# Patient Record
Sex: Female | Born: 1990 | Race: Black or African American | Hispanic: No | Marital: Single | Smoking: Light tobacco smoker
Health system: Southern US, Community
[De-identification: ages and names within clinical notes are randomized; demographics above are authoritative.]

## PROBLEM LIST (undated history)

## (undated) DIAGNOSIS — IMO0002 Reserved for concepts with insufficient information to code with codable children: Secondary | ICD-10-CM

## (undated) DIAGNOSIS — J45909 Unspecified asthma, uncomplicated: Secondary | ICD-10-CM

## (undated) DIAGNOSIS — R87619 Unspecified abnormal cytological findings in specimens from cervix uteri: Secondary | ICD-10-CM

---

## 2011-12-21 LAB — OB RESULTS CONSOLE GC/CHLAMYDIA: Gonorrhea: NEGATIVE

## 2011-12-21 LAB — OB RESULTS CONSOLE HIV ANTIBODY (ROUTINE TESTING): HIV: NONREACTIVE

## 2011-12-21 LAB — OB RESULTS CONSOLE RUBELLA ANTIBODY, IGM: Rubella: IMMUNE

## 2012-05-10 NOTE — L&D Delivery Note (Signed)
Delivery Note At 7:01 AM a viable and healthy female was delivered via  (Presentation: Left Occiput; Anterior ).  APGAR: 9, 9; weight pending.   Placenta status: Intact, Spontaneous.  Cord: 3 vessels   Pt was GBS+, she progressed so quickly in labor that she did not receive antibiotics  Anesthesia: None  Episiotomy: None Lacerations: None Suture Repair: None Est. Blood Loss (mL): 250  Mom to postpartum.  Baby to nursery-stable.  Ginia Rudell H. 08/02/2012, 7:14 AM

## 2012-07-03 LAB — OB RESULTS CONSOLE GBS: GBS: POSITIVE

## 2012-08-02 ENCOUNTER — Encounter (HOSPITAL_COMMUNITY): Payer: Self-pay

## 2012-08-02 ENCOUNTER — Inpatient Hospital Stay (HOSPITAL_COMMUNITY)
Admission: AD | Admit: 2012-08-02 | Discharge: 2012-08-04 | DRG: 373 | Disposition: A | Discharge status: Home / Self Care | Payer: BC Managed Care – PPO | Source: Ambulatory Visit | Attending: Obstetrics and Gynecology | Admitting: Obstetrics and Gynecology

## 2012-08-02 DIAGNOSIS — O99892 Other specified diseases and conditions complicating childbirth: Principal | ICD-10-CM | POA: Diagnosis present

## 2012-08-02 DIAGNOSIS — Z22330 Carrier of Group B streptococcus: Secondary | ICD-10-CM

## 2012-08-02 DIAGNOSIS — Z3480 Encounter for supervision of other normal pregnancy, unspecified trimester: Secondary | ICD-10-CM

## 2012-08-02 HISTORY — DX: Unspecified asthma, uncomplicated: J45.909

## 2012-08-02 HISTORY — DX: Unspecified abnormal cytological findings in specimens from cervix uteri: R87.619

## 2012-08-02 HISTORY — DX: Reserved for concepts with insufficient information to code with codable children: IMO0002

## 2012-08-02 LAB — CBC
HCT: 32.2 % — ABNORMAL LOW (ref 36.0–46.0)
Hemoglobin: 10.5 g/dL — ABNORMAL LOW (ref 12.0–15.0)
RBC: 4.16 MIL/uL (ref 3.87–5.11)
WBC: 9.9 10*3/uL (ref 4.0–10.5)

## 2012-08-02 LAB — ABO/RH: ABO/RH(D): O POS

## 2012-08-02 LAB — RPR: RPR Ser Ql: NONREACTIVE

## 2012-08-02 MED ORDER — DIPHENHYDRAMINE HCL 25 MG PO CAPS: 25.0000 mg | ORAL_CAPSULE | Freq: Four times a day (QID) | ORAL | Status: DC | PRN

## 2012-08-02 MED ORDER — SENNOSIDES-DOCUSATE SODIUM 8.6-50 MG PO TABS
2.0000 | ORAL_TABLET | Freq: Every day | ORAL | Status: DC
  Administered 2012-08-02 – 2012-08-03 (×2): 2 via ORAL

## 2012-08-02 MED ORDER — OXYCODONE-ACETAMINOPHEN 5-325 MG PO TABS: 1.0000 | ORAL_TABLET | ORAL | Status: DC | PRN

## 2012-08-02 MED ORDER — LACTATED RINGERS IV SOLN: 500.0000 mL | INTRAVENOUS | Status: DC | PRN

## 2012-08-02 MED ORDER — OXYTOCIN BOLUS FROM INFUSION
500.0000 mL | INTRAVENOUS | Status: DC
  Administered 2012-08-02: 500 mL via INTRAVENOUS

## 2012-08-02 MED ORDER — DIBUCAINE 1 % RE OINT: 1.0000 "application " | TOPICAL_OINTMENT | RECTAL | Status: DC | PRN

## 2012-08-02 MED ORDER — TETANUS-DIPHTH-ACELL PERTUSSIS 5-2.5-18.5 LF-MCG/0.5 IM SUSP: 0.5000 mL | Freq: Once | INTRAMUSCULAR | Status: DC

## 2012-08-02 MED ORDER — ONDANSETRON HCL 4 MG PO TABS: 4.0000 mg | ORAL_TABLET | ORAL | Status: DC | PRN

## 2012-08-02 MED ORDER — IBUPROFEN 600 MG PO TABS
600.0000 mg | ORAL_TABLET | Freq: Four times a day (QID) | ORAL | Status: DC | PRN
  Administered 2012-08-02: 600 mg via ORAL
  Filled 2012-08-02: qty 1

## 2012-08-02 MED ORDER — LIDOCAINE HCL (PF) 1 % IJ SOLN
30.0000 mL | INTRAMUSCULAR | Status: DC | PRN
  Filled 2012-08-02 (×2): qty 30

## 2012-08-02 MED ORDER — ACETAMINOPHEN 325 MG PO TABS: 650.0000 mg | ORAL_TABLET | ORAL | Status: DC | PRN

## 2012-08-02 MED ORDER — OXYTOCIN 40 UNITS IN LACTATED RINGERS INFUSION - SIMPLE MED
62.5000 mL/h | INTRAVENOUS | Status: DC
  Filled 2012-08-02: qty 1000

## 2012-08-02 MED ORDER — ZOLPIDEM TARTRATE 5 MG PO TABS: 5.0000 mg | ORAL_TABLET | Freq: Every evening | ORAL | Status: DC | PRN

## 2012-08-02 MED ORDER — LANOLIN HYDROUS EX OINT: TOPICAL_OINTMENT | CUTANEOUS | Status: DC | PRN

## 2012-08-02 MED ORDER — FLEET ENEMA 7-19 GM/118ML RE ENEM: 1.0000 | ENEMA | RECTAL | Status: DC | PRN

## 2012-08-02 MED ORDER — LACTATED RINGERS IV SOLN
INTRAVENOUS | Status: DC
  Administered 2012-08-02: 07:00:00 via INTRAVENOUS

## 2012-08-02 MED ORDER — METHYLERGONOVINE MALEATE 0.2 MG/ML IJ SOLN: 0.2000 mg | INTRAMUSCULAR | Status: DC | PRN

## 2012-08-02 MED ORDER — BENZOCAINE-MENTHOL 20-0.5 % EX AERO
1.0000 "application " | INHALATION_SPRAY | CUTANEOUS | Status: DC | PRN
  Administered 2012-08-02: 1 via TOPICAL
  Filled 2012-08-02: qty 56

## 2012-08-02 MED ORDER — PRENATAL MULTIVITAMIN CH
1.0000 | ORAL_TABLET | Freq: Every day | ORAL | Status: DC
  Administered 2012-08-02 – 2012-08-04 (×3): 1 via ORAL
  Filled 2012-08-02 (×3): qty 1

## 2012-08-02 MED ORDER — WITCH HAZEL-GLYCERIN EX PADS: 1.0000 "application " | MEDICATED_PAD | CUTANEOUS | Status: DC | PRN

## 2012-08-02 MED ORDER — METHYLERGONOVINE MALEATE 0.2 MG PO TABS: 0.2000 mg | ORAL_TABLET | ORAL | Status: DC | PRN

## 2012-08-02 MED ORDER — ONDANSETRON HCL 4 MG/2ML IJ SOLN: 4.0000 mg | Freq: Four times a day (QID) | INTRAMUSCULAR | Status: DC | PRN

## 2012-08-02 MED ORDER — SODIUM CHLORIDE 0.9 % IV SOLN
2.0000 g | Freq: Once | INTRAVENOUS | Status: DC
  Filled 2012-08-02: qty 2000

## 2012-08-02 MED ORDER — CITRIC ACID-SODIUM CITRATE 334-500 MG/5ML PO SOLN: 30.0000 mL | ORAL | Status: DC | PRN

## 2012-08-02 MED ORDER — SIMETHICONE 80 MG PO CHEW: 80.0000 mg | CHEWABLE_TABLET | ORAL | Status: DC | PRN

## 2012-08-02 MED ORDER — IBUPROFEN 600 MG PO TABS
600.0000 mg | ORAL_TABLET | Freq: Four times a day (QID) | ORAL | Status: DC
  Administered 2012-08-02 – 2012-08-04 (×10): 600 mg via ORAL
  Filled 2012-08-02 (×10): qty 1

## 2012-08-02 MED ORDER — ALBUTEROL SULFATE HFA 108 (90 BASE) MCG/ACT IN AERS
2.0000 | INHALATION_SPRAY | RESPIRATORY_TRACT | Status: DC | PRN
  Administered 2012-08-02 (×2): 2 via RESPIRATORY_TRACT
  Filled 2012-08-02: qty 6.7

## 2012-08-02 MED ORDER — MEPERIDINE HCL 50 MG PO TABS
50.0000 mg | ORAL_TABLET | ORAL | Status: DC | PRN
  Administered 2012-08-02 – 2012-08-03 (×3): 50 mg via ORAL
  Filled 2012-08-02 (×3): qty 1

## 2012-08-02 MED ORDER — ONDANSETRON HCL 4 MG/2ML IJ SOLN: 4.0000 mg | INTRAMUSCULAR | Status: DC | PRN

## 2012-08-02 NOTE — H&P (Signed)
Melissa Galloway is a 22 y.o. female presenting for Labor  22 yo G2P1001 @ 38+3 in labor.  The patient was 6/90/-1 with a BBOW in mau.  When she arrived on L&D she was anterior rim.  Amniotomy was performed and the patient quickly delivered History OB History   Grav Para Term Preterm Abortions TAB SAB Ect Mult Living   2 1 1       1      Past Medical History  Diagnosis Date  . Asthma   . Abnormal Pap smear    History reviewed. No pertinent past surgical history. Family History: family history includes Diabetes in her brother, father, mother, and sister; Drug abuse in her father; Early death in her paternal aunt; Heart disease in her mother; and Hypertension in her father and mother. Social History:  reports that she has never smoked. She does not have any smokeless tobacco history on file. She reports that she does not drink alcohol or use illicit drugs.   Prenatal Transfer Tool  Maternal Diabetes: No Genetic Screening: Normal Maternal Ultrasounds/Referrals: Normal Fetal Ultrasounds or other Referrals:  None Maternal Substance Abuse:  No Significant Maternal Medications:  None Significant Maternal Lab Results:  Lab values include: Group B Strep positive Other Comments:  None  ROS: as above  Dilation: 10 Effacement (%): 100 Station: +3 Exam by:: Dr Tenny Craw Blood pressure 119/44, pulse 106, temperature 97.9 F (36.6 C), temperature source Oral, resp. rate 20, height 5\' 7"  (1.702 m), weight 86.183 kg (190 lb). Exam Physical Exam  Prenatal labs: ABO, Rh: O/Positive/-- (03/26 0000) Antibody: Negative (03/26 0000) Rubella: Immune (08/13 0000) RPR: Nonreactive (08/13 0000)  HBsAg: Negative (08/13 0000)  HIV: Non-reactive (08/13 0000)  GBS: Positive (02/24 0000)   Assessment/Plan: 1) Admit 2) SVD 3) Pt didn;t get antibiotics for GBS d/t fast progression   Ernestene Coover H. 08/02/2012, 7:16 AM

## 2012-08-03 LAB — CBC
MCH: 25 pg — ABNORMAL LOW (ref 26.0–34.0)
MCHC: 31.8 g/dL (ref 30.0–36.0)
RDW: 14.2 % (ref 11.5–15.5)

## 2012-08-03 NOTE — Progress Notes (Signed)
Patient is eating, ambulating, voiding.  Pain control is good.  Filed Vitals:   08/02/12 1337 08/02/12 1800 08/02/12 2111 08/03/12 0611  BP: 117/68 114/69 117/63 107/61  Pulse: 74 83 82 72  Temp: 97.4 F (36.3 C) 97.9 F (36.6 C) 98.3 F (36.8 C) 97.7 F (36.5 C)  TempSrc: Oral Oral Oral Oral  Resp: 18 18 18 18   Height:      Weight:      SpO2:  94%  94%    Fundus firm Perineum without swelling.  Lab Results  Component Value Date   WBC 12.0* 08/03/2012   HGB 9.9* 08/03/2012   HCT 31.1* 08/03/2012   MCV 78.5 08/03/2012   PLT 257 08/03/2012    --/--/O POS (03/26 0635)/RI  A/P Post partum day 1.  Routine care.  Expect d/c tomorrow.    Olyn Landstrom A

## 2012-08-04 MED ORDER — IBUPROFEN 600 MG PO TABS: 600.0000 mg | ORAL_TABLET | Freq: Four times a day (QID) | ORAL | Status: DC | PRN

## 2012-08-04 NOTE — Progress Notes (Signed)
PPD#2 Pt without compliants. Ready for discharge. VSSAF IMP/ doing well  PLAN/ Will discharge.
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
## 2012-08-04 NOTE — Discharge Summary (Signed)
Obstetric Discharge Summary Reason for Admission: onset of labor Prenatal Procedures: ultrasound Intrapartum Procedures: spontaneous vaginal delivery Postpartum Procedures: none Complications-Operative and Postpartum: none Hemoglobin  Date Value Range Status  08/03/2012 9.9* 12.0 - 15.0 g/dL Final     HCT  Date Value Range Status  08/03/2012 31.1* 36.0 - 46.0 % Final    Physical Exam:  General: alert Lochia: appropriate Uterine Fundus: firm   Discharge Diagnoses: Term Pregnancy-delivered  Discharge Information: Date: 08/04/2012 Activity: pelvic rest Diet: routine Medications: PNV, Ibuprofen and Iron Condition: stable Instructions: refer to practice specific booklet Discharge to: home Follow-up Information   Follow up with Almon Hercules., MD. Schedule an appointment as soon as possible for a visit in 4 weeks.   Contact information:   [address] [place] [address] [phone]       Newborn Data: Live born female  Birth Weight: 7 lb 3.5 oz (3274 g) APGAR: 9, 9  Home with mother.  ANDERSON,MARK E 08/04/2012, 8:23 AM

## 2012-08-05 ENCOUNTER — Ambulatory Visit: Payer: Self-pay

## 2012-08-05 NOTE — Lactation Note (Signed)
This note was copied from the chart of Melissa Roseann Leisey. Lactation Consultation Note Mom is pumping and feeding expressed br milk via bottle, mom is not latching baby to breast at this point. Mom states pumping is going well, getting more volume, states no questions at this time. Mom request a [facility] loaner until she can get to the [facility] office next week. Medela Symphony pump provided for mom as a loaner, as there were no [employer]actina pumps available.  Patient Name: Melissa Galloway [id number] Date: 08/05/2012 Reason for consult: Pump rental   Maternal Data    Feeding Feeding Type: Breast Milk Feeding method: Bottle  LATCH Score/Interventions                      Lactation Tools Discussed/Used     Consult Status Consult Status: Complete    Lenard Forth 08/05/2012, 3:11 PM

## 2014-01-05 ENCOUNTER — Emergency Department (HOSPITAL_COMMUNITY)
Admission: EM | Admit: 2014-01-05 | Discharge: 2014-01-05 | Disposition: A | Discharge status: Home / Self Care | Payer: Self-pay | Attending: Emergency Medicine | Admitting: Emergency Medicine

## 2014-01-05 ENCOUNTER — Encounter (HOSPITAL_COMMUNITY): Payer: Self-pay | Admitting: Emergency Medicine

## 2014-01-05 DIAGNOSIS — R1013 Epigastric pain: Secondary | ICD-10-CM | POA: Insufficient documentation

## 2014-01-05 DIAGNOSIS — Z87891 Personal history of nicotine dependence: Secondary | ICD-10-CM | POA: Insufficient documentation

## 2014-01-05 DIAGNOSIS — Z3202 Encounter for pregnancy test, result negative: Secondary | ICD-10-CM | POA: Insufficient documentation

## 2014-01-05 DIAGNOSIS — J45909 Unspecified asthma, uncomplicated: Secondary | ICD-10-CM | POA: Insufficient documentation

## 2014-01-05 DIAGNOSIS — R110 Nausea: Secondary | ICD-10-CM | POA: Insufficient documentation

## 2014-01-05 DIAGNOSIS — Z79899 Other long term (current) drug therapy: Secondary | ICD-10-CM | POA: Insufficient documentation

## 2014-01-05 LAB — CBC WITH DIFFERENTIAL/PLATELET
BASOS ABS: 0.1 10*3/uL (ref 0.0–0.1)
Basophils Relative: 1 % (ref 0–1)
EOS PCT: 4 % (ref 0–5)
Eosinophils Absolute: 0.4 10*3/uL (ref 0.0–0.7)
HCT: 39.9 % (ref 36.0–46.0)
Hemoglobin: 13.8 g/dL (ref 12.0–15.0)
LYMPHS PCT: 17 % (ref 12–46)
Lymphs Abs: 1.9 10*3/uL (ref 0.7–4.0)
MCH: 30.1 pg (ref 26.0–34.0)
MCHC: 34.6 g/dL (ref 30.0–36.0)
MCV: 87.1 fL (ref 78.0–100.0)
Monocytes Absolute: 0.8 10*3/uL (ref 0.1–1.0)
Monocytes Relative: 7 % (ref 3–12)
NEUTROS ABS: 7.8 10*3/uL — AB (ref 1.7–7.7)
NEUTROS PCT: 71 % (ref 43–77)
PLATELETS: 312 10*3/uL (ref 150–400)
RBC: 4.58 MIL/uL (ref 3.87–5.11)
RDW: 12.8 % (ref 11.5–15.5)
WBC: 10.9 10*3/uL — AB (ref 4.0–10.5)

## 2014-01-05 LAB — LIPASE, BLOOD: LIPASE: 19 U/L (ref 11–59)

## 2014-01-05 LAB — COMPREHENSIVE METABOLIC PANEL
ALT: 11 U/L (ref 0–35)
AST: 18 U/L (ref 0–37)
Albumin: 4.1 g/dL (ref 3.5–5.2)
Alkaline Phosphatase: 76 U/L (ref 39–117)
Anion gap: 13 (ref 5–15)
BUN: 12 mg/dL (ref 6–23)
CALCIUM: 9.1 mg/dL (ref 8.4–10.5)
CO2: 24 mEq/L (ref 19–32)
Chloride: 103 mEq/L (ref 96–112)
Creatinine, Ser: 1.17 mg/dL — ABNORMAL HIGH (ref 0.50–1.10)
GFR calc Af Amer: 74 mL/min — ABNORMAL LOW (ref >90)
GFR, EST NON AFRICAN AMERICAN: 64 mL/min — AB (ref >90)
Glucose, Bld: 93 mg/dL (ref 70–99)
Potassium: 3.7 mEq/L (ref 3.7–5.3)
Sodium: 140 mEq/L (ref 137–147)
Total Bilirubin: 0.5 mg/dL (ref 0.3–1.2)
Total Protein: 7.4 g/dL (ref 6.0–8.3)

## 2014-01-05 LAB — URINALYSIS, ROUTINE W REFLEX MICROSCOPIC
Bilirubin Urine: NEGATIVE
GLUCOSE, UA: NEGATIVE mg/dL
Hgb urine dipstick: NEGATIVE
KETONES UR: NEGATIVE mg/dL
LEUKOCYTES UA: NEGATIVE
Nitrite: NEGATIVE
Protein, ur: NEGATIVE mg/dL
Specific Gravity, Urine: 1.011 (ref 1.005–1.030)
Urobilinogen, UA: 1 mg/dL (ref 0.0–1.0)
pH: 5.5 (ref 5.0–8.0)

## 2014-01-05 LAB — PREGNANCY, URINE: Preg Test, Ur: NEGATIVE

## 2014-01-05 MED ORDER — OXYCODONE-ACETAMINOPHEN 5-325 MG PO TABS: 1.0000 | ORAL_TABLET | Freq: Four times a day (QID) | ORAL | Status: DC | PRN

## 2014-01-05 MED ORDER — PROMETHAZINE HCL 25 MG PO TABS: 25.0000 mg | ORAL_TABLET | Freq: Three times a day (TID) | ORAL | Status: DC | PRN

## 2014-01-05 MED ORDER — SODIUM CHLORIDE 0.9 % IV BOLUS (SEPSIS)
1000.0000 mL | Freq: Once | INTRAVENOUS | Status: AC
  Administered 2014-01-05: 1000 mL via INTRAVENOUS

## 2014-01-05 MED ORDER — ONDANSETRON HCL 4 MG/2ML IJ SOLN
4.0000 mg | Freq: Once | INTRAMUSCULAR | Status: AC
  Administered 2014-01-05: 4 mg via INTRAVENOUS
  Filled 2014-01-05: qty 2

## 2014-01-05 MED ORDER — MORPHINE SULFATE 4 MG/ML IJ SOLN
4.0000 mg | Freq: Once | INTRAMUSCULAR | Status: AC
Start: 2014-01-05 — End: 2014-01-05
  Administered 2014-01-05: 4 mg via INTRAVENOUS
  Filled 2014-01-05: qty 1

## 2014-01-05 MED ORDER — RANITIDINE HCL 150 MG PO TABS: 150.0000 mg | ORAL_TABLET | Freq: Every day | ORAL | Status: DC

## 2014-01-05 NOTE — ED Notes (Signed)
Pt reports intermittent abdominal pain all week with morning sickness, sensitive smell. Pt took pregnancy test at home which was negative. Last bm yesterday. lmp this week as well. Pt has implant. 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

## 2014-01-05 NOTE — Discharge Instructions (Signed)
Use phenergan as prescribed, as needed for nausea. Stay well hydrated with small sips of fluids throughout the day. Use Zantac as directed to help with your gastritis (irritation of the lining of your stomach), up to twice daily. Always eat when taking ibuprofen/NSAIDs. Use percocet for severe pain, as directed, and only when needed, do not drive or operate machinery while taking this. Return to ER for changing or worsening of symptoms. See a regular doctor in 1 week for follow up, using the resource guide below to find one.  Abdominal Pain Many things can cause abdominal pain. Usually, abdominal pain is not caused by a disease and will improve without treatment. It can often be observed and treated at home. Your health care provider will do a physical exam and possibly order blood tests and X-rays to help determine the seriousness of your pain. However, in many cases, more time must pass before a clear cause of the pain can be found. Before that point, your health care provider may not know if you need more testing or further treatment. HOME CARE INSTRUCTIONS  Monitor your abdominal pain for any changes. The following actions may help to alleviate any discomfort you are experiencing:  Only take over-the-counter or prescription medicines as directed by your health care provider.  Do not take laxatives unless directed to do so by your health care provider.  Try a clear liquid diet (broth, tea, or water) as directed by your health care provider. Slowly move to a bland diet as tolerated. SEEK MEDICAL CARE IF:  You have unexplained abdominal pain.  You have abdominal pain associated with nausea or diarrhea.  You have pain when you urinate or have a bowel movement.  You experience abdominal pain that wakes you in the night.  You have abdominal pain that is worsened or improved by eating food.  You have abdominal pain that is worsened with eating fatty foods.  You have a fever. SEEK IMMEDIATE  MEDICAL CARE IF:   Your pain does not go away within 2 hours.  You keep throwing up (vomiting).  Your pain is felt only in portions of the abdomen, such as the right side or the left lower portion of the abdomen.  You pass bloody or black tarry stools. MAKE SURE YOU:  Understand these instructions.   Will watch your condition.   Will get help right away if you are not doing well or get worse.  Document Released: 02/03/2005 Document Revised: 05/01/2013 Document Reviewed: 01/03/2013 [facility] Patient Information 2015 [place], [place]. This information is not intended to replace advice given to you by your health care provider. Make sure you discuss any questions you have with your health care provider.  Nausea, Adult Nausea means you feel sick to your stomach or need to throw up (vomit). It may be a sign of a more serious problem. If nausea gets worse, you may throw up. If you throw up a lot, you may lose too much body fluid (dehydration). HOME CARE   Get plenty of rest.  Ask your doctor how to replace body fluid losses (rehydrate).  Eat small amounts of food. Sip liquids more often.  Take all medicines as told by your doctor. GET HELP RIGHT AWAY IF:  You have a fever.  You pass out (faint).  You keep throwing up or have blood in your throw up.  You are very weak, have dry lips or a dry mouth, or you are very thirsty (dehydrated).  You have dark or bloody  poop (stool).  You have very bad chest or belly (abdominal) pain.  You do not get better after 2 days, or you get worse.  You have a headache. MAKE SURE YOU:  Understand these instructions.  Will watch your condition.  Will get help right away if you are not doing well or get worse. Document Released: 04/15/2011 Document Revised: 07/19/2011 Document Reviewed: 04/15/2011 [facility] Patient Information 2015 [place], [place]. This information is not intended to replace advice given to you by your health care provider.  Make sure you discuss any questions you have with your health care provider.   Emergency Department Resource Guide 1) Find a Doctor and Pay Out of Pocket Although you won't have to find out who is covered by your insurance plan, it is a good idea to ask around and get recommendations. You will then need to call the office and see if the doctor you have chosen will accept you as a new patient and what types of options they offer for patients who are self-pay. Some doctors offer discounts or will set up payment plans for their patients who do not have insurance, but you will need to ask so you aren't surprised when you get to your appointment.  2) Contact Your Local Health Department Not all health departments have doctors that can see patients for sick visits, but many do, so it is worth a call to see if yours does. If you don't know where your local health department is, you can check in your phone book. The CDC also has a tool to help you locate your state's health department, and many state websites also have listings of all of their local health departments.  3) Find a Walk-in Clinic If your illness is not likely to be very severe or complicated, you may want to try a walk in clinic. These are popping up all over the country in pharmacies, drugstores, and shopping centers. They're usually staffed by nurse practitioners or physician assistants that have been trained to treat common illnesses and complaints. They're usually fairly quick and inexpensive. However, if you have serious medical issues or chronic medical problems, these are probably not your best option.  No Primary Care Doctor: - Call Health Connect at  [phone] - they can help you locate a primary care doctor that  accepts your insurance, provides certain services, etc. - Physician Referral Service- [phone]  Chronic Pain Problems: Organization         Address  Phone   Notes  [facility]  [phone]  Patients need to be referred by their primary care doctor.   Medication Assistance: Organization         Address  Phone   Notes  [facility] Medication [facility] [address] [place] [place], [place] [address] [phone] --Must be a resident of [facility] -- Must have NO insurance coverage whatsoever (no Medicaid/ Medicare, etc.) -- The pt. MUST have a primary care doctor that directs their care regularly and follows them in the community   MedAssist  [phone]   [employer]  [phone]    Agencies that provide inexpensive medical care: Organization         Address  Phone   Notes  [facility]  [phone]   [facility]    [phone]   [facility] [address] [place], [place] [address] [phone]   [facility]  [address] [place] [phone]   Planned Parenthood    [phone]   [facility]    [phone]   Community Health and [facility]  [address], [place] Phone:  [phone], Fax:  [phone] Hours of Operation:  9 am - 6 pm, M-F.  Also accepts Medicaid/Medicare and self-pay.  [facility]  [address], [place] Phone: [phone], Fax: [phone]. Hours of Operation:  8:30 am - 5:30 pm, M-F.  Also accepts Medicaid and self-pay.  [facility] [place] [address], [place] Phone: [phone]   [facility] [address] Natasha Bence [place], [place] [phone], Ext. [phone] Mondays & Thursdays: 7-9 AM.  First 15 patients are seen on a first come, first serve basis.    Medicaid-accepting [facility] Providers:  Organization         Address  Phone   Notes  [facility] [address], [place] [phone] Also accepts self-pay patients.  [facility] [address] Laurell Josephs [place], [place]  [phone]   [facility] [address], [place] [phone]   [facility] [address] [place], [place] [phone]   Renaye Rakers [address], [place]   [phone] Only accepts [place] Access [place] patients after they have their name applied to their card.   Self-Pay (no insurance) in [facility]:  Organization         Address  Phone   Notes  Sickle Cell Patients, [facility] [address] [place], [place] [phone]   [facility] [address] [place], [place] [phone]   [facility] [place]  [address], [place] [phone]   [facility]/Dr. Osei-Bonsu  [address] [place] or [address], [place] [phone] Phone number for both [place] and [place] locations is the same.  Urgent Medical and [facility] [address] [place] [phone]   [facility] [address], [place] or [address] [phone]   [facility] [address], [place] [phone], phone; [phone], fax Sees patients 1st and 3rd Saturday of every month.  Must not qualify for public or private insurance (i.e. Medicaid, Medicare, [place] Health Choice, Veterans' Benefits)  Household income should be no more than 200% of the poverty level The clinic cannot treat you if you are pregnant or think you are pregnant  Sexually transmitted diseases are not treated at the clinic.    Dental Care: Organization         Address  Phone  Notes  [facility] Department of [facility] [address] [place], [place] [phone] Accepts children up to age 9 who are enrolled in [place] or [place] Health Choice; pregnant women with a Medicaid card; and children who have applied for Medicaid or [place] Health Choice, but were  declined, whose parents can pay a reduced fee at time of service.  [facility] Department of [facility]  [address], [place] [phone] Accepts children up to age 59 who are enrolled in [place] or [place] Health Choice; pregnant women with a Medicaid card; and children who have applied  for Medicaid or [place] Health Choice, but were declined, whose parents can pay a reduced fee at time of service.  [facility]  [address] [place], [place] [phone] Patients are seen by appointment only. Walk-ins are not accepted. [facility] will see patients 72 years of age and older. Monday - Tuesday (8am-5pm) Most Wednesdays (8:30-5pm) $30 per visit, cash only  [facility] Adult Dental Access PROGRAM  [address], [facility] [phone] Patients are seen by appointment only. Walk-ins are not accepted. [facility] will see patients 22 years of age and older. One Wednesday Evening (Monthly: Volunteer Based).  $30 per visit, cash only  [employer]  [phone] for adults; Children under age 19, call Graduate Pediatric Dentistry at [phone]. Children aged 13-14, please call [phone] to request a pediatric application.  Dental services are provided in all areas of dental care including fillings, crowns and bridges, complete and partial dentures, implants, gum treatment, root canals, and extractions. Preventive care is also provided. Treatment is provided to both adults and children. Patients are selected via a lottery and there is often a waiting list.   [facility] [address] [place]  [phone] [email or web address]   [facility] [address] [place], [place] [phone], Ext. [phone] Second and Fourth Thursday of each month, opens at 6:30 AM; Clinic ends at 9 AM.  Patients are seen on a first-come first-served basis, and a limited number are seen during each clinic.    [facility]  [address] Ether Griffins [place], [place] [phone]   Eligibility Requirements You must have lived in [place], [place], or [place] counties for at least the last three months.   You cannot be eligible for state or federal sponsored [place], including [employer], [place], or [employer].   You generally cannot be eligible for healthcare insurance through your employer.    How to apply: Eligibility screenings are held every Tuesday and Wednesday afternoon from 1:00 pm until 4:00 pm. You do not need an appointment for the interview!  [facility] [address], [place], [place] [phone]   [facility] Health Department  [phone]   [facility] Health Department  [phone]   [facility] Health Department  [phone]    Behavioral Health Resources in the Community: Intensive Outpatient Programs Organization         Address  Phone  Notes  [facility] Services [address] [place], [place] [phone]   [facility] [address] [place], [place] [phone]   [facility]: [facility] [address] [place], [place]  [phone]   [facility] [address]  [place], [place] [phone] or [phone]   Substance Abuse Resources Organization         Address  Phone  Notes  Al[facility] Drug Services  [phone]   [facility]  [phone]   [facility]  [phone]   Floydene Flock  [phone]   Residential & Outpatient Substance Abuse Program  [phone]   Psychological Services Organization         Address  Phone  Notes  [facility] Health  [phone]   [facility] Services  [phone]   [facility] Mental Health [address], [place] [phone] or [phone]    Mobile Crisis Teams Organization         Address  Phone  Notes  [facility], Mobile Crisis Care  Unit  [phone]   [facility]  [address] [place], [place] [phone]   [facility] [address] [place] [phone]    Self-Help/Support Groups Organization         Address  Phone             Notes  [facility] [place] - variety of support groups  336- I7437963 Call for more information  Narcotics Anonymous (NA), [facility] [address], [employer] [place]  2 meetings at this location   Statistician         Address  Phone  Notes  [facility] [address],    [place]  [phone]   [facility]  [address] [place] [address], [place], [place] [phone]   [facility] [address] [place], [place] [phone] Admissions: 8am-3pm M-F  [facility] [address],    [place], [place] [phone]   [facility] [address] [place], [place], [place] [phone]   [facility] [address],  [place], [place] [phone]   [facility] - Intensive Outpatient [address]ure[address], [place], [place] [phone]   [facility] ([facility] [address] [place]  [place], [place] [phone] or [phone]   [facility] ([facility]) [address] [place], [place] [phone] Accepts Medicaid  [facility] [address],  [place] [phone] Substance Abuse/Addiction Treatment   [facility] Organization         Address  Phone  Notes  [facility]  [phone]   Angie Fava, PhD [address] Ervin Knack [place], [place]   [phone] or [phone]   [facility]   [address] [place], [place] [phone]   [facility] [address], [place], [place] [phone] Insurance/Medicaid/sponsorship through [facility] and Families [address]                                     [place], [place] [phone] Therapy/tele-psych/case  [facility] [address], [place] [phone]    Dr. Lolly Mustache  [phone]   [facility]  [employer] [facility] Dept. 1) [address], [place] 2) [address] [place], [place] 3)  [address], [place] [phone]  [phone]   [facility] Child Abuse Hotline (859)[phone] or [phone] (After Hours)

## 2014-01-05 NOTE — ED Provider Notes (Signed)
CSN: [id number]     Arrival date & time 01/05/14  1455 History   First MD Initiated Contact with Patient 01/05/14 1612     Chief Complaint  Patient presents with  . Abdominal Pain     (Consider location/radiation/quality/duration/timing/severity/associated sxs/prior Treatment) HPI Comments: Melissa Galloway is a 23 y.o. female with a PMHx of asthma, who presents to the ED with complaints of upper abdominal pain that began gradually, initially starting 5 days ago (Tuesday). States that the pain is 5/10, crampy, intermittent, localized in her epigastrium, nonradiating, with no known aggravating or alleviating factors, given that she has not tried anything for her pain. She states that this began after starting a course of ibuprofen, approximately /day for 5 days for a tooth ache last week. Endorses associated intermittent waves of nausea which is aggravated by smells and worse in the morning, as well as some decreased appetite. States that her last BM was last night and she continues to pass gas normally. She thought she was pregnant therefore she took 2 home pregnancy tests which were both negative. She states that her last menstrual period began on Sunday and has been ongoing this week, and consistent with her regular menses. She has a Nexplanon in place. She denies any fevers, chills, URI symptoms, chest pain, shortness of breath, cough, vomiting, melena, hematochezia, diarrhea, constipation, urinary symptoms, vaginal symptoms, rashes, myalgias, or arthralgias. Denies alcohol use, recent diet changes, recent illness, recent travel, sick contacts, or suspicious food intake. No abdominal surgeries.  Patient is a 23 y.o. female presenting with abdominal pain. The history is provided by the patient. No language interpreter was used.  Abdominal Pain Pain location:  Epigastric Pain quality: cramping   Pain radiates to:  Does not radiate Pain severity:  Moderate (5/10) Onset quality:   Gradual Duration:  5 days Timing:  Intermittent Progression:  Unchanged Chronicity:  New Context: not alcohol use, not diet changes, not eating, not recent illness, not recent travel, not sick contacts and not suspicious food intake   Context comment:  Ibuprofen use,  daily x5d Relieved by:  None tried Worsened by:  Nothing tried Ineffective treatments:  None tried Associated symptoms: anorexia and nausea (intermittent, coming in waves, worse with smells)   Associated symptoms: no belching, no chest pain, no chills, no constipation, no cough, no diarrhea, no dysuria, no fever, no flatus, no hematemesis, no hematochezia, no hematuria, no melena, no shortness of breath, no sore throat, no vaginal bleeding, no vaginal discharge and no vomiting   Risk factors: NSAID use     Past Medical History  Diagnosis Date  . Asthma    History reviewed. No pertinent past surgical history. No family history on file. History  Substance Use Topics  . Smoking status: Former Games developer  . Smokeless tobacco: Not on file  . Alcohol Use: Yes     Comment: occasionally   OB History   Grav Para Term Preterm Abortions TAB SAB Ect Mult Living                 Review of Systems  Constitutional: Negative for fever and chills.  HENT: Negative for rhinorrhea and sore throat.   Respiratory: Negative for cough and shortness of breath.   Cardiovascular: Negative for chest pain.  Gastrointestinal: Positive for nausea (intermittent, coming in waves, worse with smells), abdominal pain and anorexia. Negative for vomiting, diarrhea, constipation, blood in stool, melena, hematochezia, abdominal distention, anal bleeding, rectal pain, flatus and hematemesis.  Genitourinary: Negative for  dysuria, urgency, frequency, hematuria, flank pain, decreased urine volume, vaginal bleeding, vaginal discharge, difficulty urinating, vaginal pain, menstrual problem and pelvic pain.  Musculoskeletal: Negative for arthralgias, back  pain and myalgias.  Skin: Negative for rash.  Neurological: Negative for dizziness, weakness and light-headedness.  10 Systems reviewed and are negative for acute change except as noted in the HPI.     Allergies  Codeine  Home Medications   Prior to Admission medications   Medication Sig Start Date End Date Taking? Authorizing Provider  albuterol (PROAIR HFA) 108 (90 BASE) MCG/ACT inhaler Inhale 1-2 puffs into the lungs every 6 (six) hours as needed for wheezing or shortness of breath.   Yes Historical Provider, MD  ibuprofen (ADVIL,MOTRIN) 200 MG tablet Take 1,000 mg by mouth 2 (two) times daily as needed (pain).   Yes Historical Provider, MD  oxyCODONE-acetaminophen (PERCOCET) 5-325 MG per tablet Take 1-2 tablets by mouth every 6 (six) hours as needed for severe pain. 01/05/14   Aakash Hollomon Strupp Camprubi-Soms, PA-C  promethazine (PHENERGAN) 25 MG tablet Take 1 tablet (25 mg total) by mouth every 8 (eight) hours as needed for nausea or vomiting. 01/05/14   Donnita Falls Camprubi-Soms, PA-C  ranitidine (ZANTAC) 150 MG tablet Take 1 tablet (150 mg total) by mouth daily with breakfast. May take additional dose at bedtime. 01/05/14   Jeneen Doutt Strupp Camprubi-Soms, PA-C   BP 116/71  Pulse 74  Temp(Src) 98.4 F (36.9 C) (Oral)  Resp 18  Ht  (1.626 m)  Wt 170 lb (77.111 kg)  BMI 29.17 kg/m2  SpO2 96%  LMP 12/30/2013 Physical Exam  Nursing note and vitals reviewed. Constitutional: She is oriented to person, place, and time. Vital signs are normal. She appears well-developed and well-nourished. No distress.  VSS, NAD, afebrile  HENT:  Head: Normocephalic and atraumatic.  Mouth/Throat: Oropharynx is clear and moist and mucous membranes are normal.  Eyes: Conjunctivae and EOM are normal. Right eye exhibits no discharge. Left eye exhibits no discharge.  Neck: Normal range of motion. Neck supple.  Cardiovascular: Normal rate, regular rhythm, normal heart sounds and intact distal  pulses.  Exam reveals no gallop and no friction rub.   No murmur heard. Pulmonary/Chest: Effort normal and breath sounds normal. She has no wheezes. She has no rhonchi. She has no rales.  Abdominal: Soft. Normal appearance and bowel sounds are normal. She exhibits no distension. There is tenderness in the epigastric area. There is no rigidity, no rebound, no guarding, no CVA tenderness, no tenderness at McBurney's point and negative Murphy's sign.    Soft, nondistended, +BS throughout, with TTP in epigastrum, no r/g/r, neg murphy's, neg mcburney's, no CVA TTP  Musculoskeletal: Normal range of motion.  Neurological: She is alert and oriented to person, place, and time.  Skin: Skin is warm, dry and intact. No rash noted.  Psychiatric: She has a normal mood and affect.    ED Course  Procedures (including critical care time) Labs Review Labs Reviewed  CBC WITH DIFFERENTIAL - Abnormal; Notable for the following:    WBC 10.9 (*)    Neutro Abs 7.8 (*)    All other components within normal limits  COMPREHENSIVE METABOLIC PANEL - Abnormal; Notable for the following:    Creatinine, Ser 1.17 (*)    GFR calc non Af Amer 64 (*)    GFR calc Af Amer 74 (*)    All other components within normal limits  PREGNANCY, URINE  URINALYSIS, ROUTINE W REFLEX MICROSCOPIC  LIPASE, BLOOD  Imaging Review No results found.   EKG Interpretation None      MDM   Final diagnoses:  Epigastric abdominal pain  Nausea alone    23y/o female with epigastric abd pain after course of NSAIDs. States she didn't take food or prilosec/zantac with it, and noticed the pain began after. CBC w/diff unremarkable, CMP showing mildly elevated Cr at 1.17 and GFR 74, likely mild dehydration but unsure of baseline, will give 1L bolus. Upreg neg, U/A neg. Will obtain lipase, give fluids and nausea meds now then reassess. Plan to d/c home with prilosec/zantac depending on cost, and nausea meds. Doubt need for imaging at this  time, likely simple NSAID gastritis.  4:57 PM Lipase neg, doubt pancreatitis. Will assess after meds and PO challenge prior to discharge.   6:09 PM PO challenge without continued N/V. Bolus finished, pt feels improved. Will rx zantac due to cost concern, and phenergan for nausea. Will give small supply of percocet but discussed that NSAIDs should always be taken with food and zantac, and needs to find PCP for f/up in 1 wk. I explained the diagnosis and have given explicit precautions to return to the ER including for any other new or worsening symptoms. The patient understands and accepts the medical plan as it's been dictated and I have answered their questions. Discharge instructions concerning home care and prescriptions have been given. The patient is STABLE and is discharged to home in good condition.  BP 111/66  Pulse 68  Temp(Src) 98.7 F (37.1 C) (Oral)  Resp 14  Ht  (1.626 m)  Wt 170 lb (77.111 kg)  BMI 29.17 kg/m2  SpO2 100%  LMP 12/30/2013  Meds ordered this encounter  Medications  . sodium chloride 0.9 % bolus 1,000 mL    Sig:   . morphine 4 MG/ML injection 4 mg    Sig:   . ondansetron (ZOFRAN) injection 4 mg    Sig:   . oxyCODONE-acetaminophen (PERCOCET) 5-325 MG per tablet    Sig: Take 1-2 tablets by mouth every 6 (six) hours as needed for severe pain.    Dispense:  6 tablet    Refill:  0    Order Specific Question:  Supervising Provider    Answer:  Eber Hong D [[id number]]  . ranitidine (ZANTAC) 150 MG tablet    Sig: Take 1 tablet (150 mg total) by mouth daily with breakfast. May take additional dose at bedtime.    Dispense:  30 tablet    Refill:  0    Order Specific Question:  Supervising Provider    Answer:  Eber Hong D [[id number]]  . promethazine (PHENERGAN) 25 MG tablet    Sig: Take 1 tablet (25 mg total) by mouth every 8 (eight) hours as needed for nausea or vomiting.    Dispense:  10 tablet    Refill:  0    Order Specific Question:  Supervising  Provider    Answer:  Vida Roller [address] Camprubi-Soms, PA-C 01/05/14 1818

## 2014-01-05 NOTE — ED Notes (Signed)
Pt declines wheelchair for discharge

## 2014-01-06 NOTE — ED Provider Notes (Signed)
Medical screening examination/treatment/procedure(s) were performed by non-physician practitioner and as supervising physician I was immediately available for consultation/collaboration.   EKG Interpretation None        Eltha Tingley, MD 01/06/14 0000 

## 2014-03-11 ENCOUNTER — Encounter (HOSPITAL_COMMUNITY): Payer: Self-pay

## 2016-03-15 ENCOUNTER — Encounter (HOSPITAL_COMMUNITY): Payer: Self-pay

## 2017-05-10 NOTE — L&D Delivery Note (Signed)
Patient is a 27 y.o. now G3P3 s/p NSVD at [redacted]w[redacted]d, who was admitted for SOL.  She progressed without augmentation to complete and pushed 2 minutes to deliver.  Cord clamping delayed by several minutes then clamped by CNM and cut by FOB.  Placenta intact and spontaneous, bleeding minimal.  Mom and baby stable prior to transfer to postpartum. She plans on Formula feeding. She requests IUD for birth control.  Delivery Note At 2:37 AM a viable and healthy female was delivered via Vaginal, Spontaneous (Presentation: ROA ).  APGAR: 8, 9; weight pending .   Placenta intact and spontaneous, bleeding minimal. 3VCord:  with no complications.  Anesthesia: Epidural Episiotomy: None Lacerations: None Suture Repair: None Est. Blood Loss (mL): 251  Mom to postpartum.  Baby to Couplet care / Skin to Skin.  Sharyon Cable CNM 02/27/2018, 3:33 AM

## 2017-07-19 ENCOUNTER — Encounter: Payer: Self-pay | Admitting: Advanced Practice Midwife

## 2017-07-19 ENCOUNTER — Other Ambulatory Visit (HOSPITAL_COMMUNITY)
Admission: RE | Admit: 2017-07-19 | Discharge: 2017-07-19 | Disposition: A | Discharge status: Home / Self Care | Payer: Medicaid Other | Source: Ambulatory Visit | Attending: Advanced Practice Midwife | Admitting: Advanced Practice Midwife

## 2017-07-19 ENCOUNTER — Ambulatory Visit (INDEPENDENT_AMBULATORY_CARE_PROVIDER_SITE_OTHER): Payer: Medicaid Other | Admitting: Advanced Practice Midwife

## 2017-07-19 DIAGNOSIS — Z3687 Encounter for antenatal screening for uncertain dates: Secondary | ICD-10-CM

## 2017-07-19 DIAGNOSIS — Z3483 Encounter for supervision of other normal pregnancy, third trimester: Secondary | ICD-10-CM | POA: Insufficient documentation

## 2017-07-19 DIAGNOSIS — Z3481 Encounter for supervision of other normal pregnancy, first trimester: Secondary | ICD-10-CM

## 2017-07-19 MED ORDER — PRENATAL VITAMINS 0.8 MG PO TABS: 1.0000 | ORAL_TABLET | Freq: Every day | ORAL | 12 refills | Status: DC

## 2017-07-19 MED ORDER — ALBUTEROL SULFATE HFA 108 (90 BASE) MCG/ACT IN AERS: 1.0000 | INHALATION_SPRAY | Freq: Four times a day (QID) | RESPIRATORY_TRACT | 1 refills | Status: DC | PRN

## 2017-07-19 NOTE — Progress Notes (Signed)
  Subjective:    Melissa Galloway is a [id number] 238w2d being seen today for her first obstetrical visit.  Her obstetrical history is significant for multiparity. Patient does intend to breast feed. Pregnancy history fully reviewed.  Patient reports no complaints.  Needs Rx for inhaler.  Wheezes at night sometimes  Vitals:   07/19/17 0911  BP: 132/75  Pulse: (!) 105  Weight: 193 lb (87.5 kg)    HISTORY: OB History  Gravida Para Term Preterm AB Living  3 2 2  0 0 2  SAB TAB Ectopic Multiple Live Births  0 0 0   2    # Outcome Date GA Lbr Len/2nd Weight Sex Delivery Anes PTL Lv  3 Current           2 Term 08/02/12 1227w3d 08:54 / 00:07 7 lb 7 oz (3.374 kg) F Vag-Spont None  LIV     Birth Comments: WNL  1 Term 08/2010 4322w0d   F Vag-Spont EPI  LIV     Past Medical History:  Diagnosis Date  . Abnormal Pap smear   . Asthma    History reviewed. No pertinent surgical history. Family History  Problem Relation Age of Onset  . Heart disease Mother   . Hypertension Mother   . Diabetes Mother   . Hypertension Father   . Diabetes Father   . Drug abuse Father   . Diabetes Sister   . Diabetes Brother   . Early death Paternal Aunt      Exam    Uterus:     Pelvic Exam:    Perineum: Normal Perineum   Vulva: Bartholin's, Urethra, Skene's normal   Vagina:  normal mucosa, normal discharge   pH:    Cervix: multiparous appearance and posterior, difficult to see   Adnexa: normal adnexa and no mass, fullness, tenderness   Bony Pelvis: gynecoid  System: Breast:  normal appearance, no masses or tenderness   Skin: normal coloration and turgor, no rashes    Neurologic: oriented, grossly non-focal   Extremities: normal strength, tone, and muscle mass   HEENT neck supple with midline trachea   Mouth/Teeth mucous membranes moist, pharynx normal without lesions   Neck supple   Cardiovascular: regular rate and rhythm, no murmurs or gallops   Respiratory:  appears well, vitals normal, no  respiratory distress, acyanotic, normal RR, ear and throat exam is normal, neck free of mass or lymphadenopathy, chest clear, no wheezing, crepitations, rhonchi, normal symmetric air entry   Abdomen: soft, non-tender; bowel sounds normal; no masses,  no organomegaly   Urinary: urethral meatus normal      Assessment:    Pregnancy: [id number] Patient Active Problem List   Diagnosis Date Noted  . Supervision of normal intrauterine pregnancy in multigravida in first trimester 07/19/2017  Asthma      Plan:     Initial labs drawn. Prenatal vitamins. Problem list reviewed and updated. Genetic Screening discussed First Screen and Integrated Screen: ordered.  Ultrasound discussed; fetal survey: planned  Follow up in 4 weeks. 50% of 30 min visit spent on counseling and coordination of care.  Routines reviewed Welcomed to practice, reviewed how we operate Rx albuterol inhaler for PRN use   Wynelle BourgeoisMarie Adriella [place] 07/19/2017

## 2017-07-19 NOTE — Patient Instructions (Signed)
Prenatal Care WHAT IS PRENATAL CARE? Prenatal care is the process of caring for a pregnant woman before she gives birth. Prenatal care makes sure that she and her baby remain as healthy as possible throughout pregnancy. Prenatal care may be provided by a midwife, family practice health care provider, or a childbirth and pregnancy specialist (obstetrician). Prenatal care may include physical examinations, testing, treatments, and education on nutrition, lifestyle, and social support services. WHY IS PRENATAL CARE SO IMPORTANT? Early and consistent prenatal care increases the chance that you and your baby will remain healthy throughout your pregnancy. This type of care also decreases a baby's risk of being born too early (prematurely), or being born smaller than expected (small for gestational age). Any underlying medical conditions you may have that could pose a risk during your pregnancy are discussed during prenatal care visits. You will also be monitored regularly for any new conditions that may arise during your pregnancy so they can be treated quickly and effectively. WHAT HAPPENS DURING PRENATAL CARE VISITS? Prenatal care visits may include the following: Discussion Tell your health care provider about any new signs or symptoms you have experienced since your last visit. These might include:  Nausea or vomiting.  Increased or decreased level of energy.  Difficulty sleeping.  Back or leg pain.  Weight changes.  Frequent urination.  Shortness of breath with physical activity.  Changes in your skin, such as the development of a rash or itchiness.  Vaginal discharge or bleeding.  Feelings of excitement or nervousness.  Changes in your baby's movements.  You may want to write down any questions or topics you want to discuss with your health care provider and bring them with you to your appointment. Examination During your first prenatal care visit, you will likely have a complete  physical exam. Your health care provider will often examine your vagina, cervix, and the position of your uterus, as well as check your heart, lungs, and other body systems. As your pregnancy progresses, your health care provider will measure the size of your uterus and your baby's position inside your uterus. He or she may also examine you for early signs of labor. Your prenatal visits may also include checking your blood pressure and, after about 10-12 weeks of pregnancy, listening to your baby's heartbeat. Testing Regular testing often includes:  Urinalysis. This checks your urine for glucose, protein, or signs of infection.  Blood count. This checks the levels of white and red blood cells in your body.  Tests for sexually transmitted infections (STIs). Testing for STIs at the beginning of pregnancy is routinely done and is required in many states.  Antibody testing. You will be checked to see if you are immune to certain illnesses, such as rubella, that can affect a developing fetus.  Glucose screen. Around 24-28 weeks of pregnancy, your blood glucose level will be checked for signs of gestational diabetes. Follow-up tests may be recommended.  Group B strep. This is a bacteria that is commonly found inside a woman's vagina. This test will inform your health care provider if you need an antibiotic to reduce the amount of this bacteria in your body prior to labor and childbirth.  Ultrasound. Many pregnant women undergo an ultrasound screening around 18-20 weeks of pregnancy to evaluate the health of the fetus and check for any developmental abnormalities.  HIV (human immunodeficiency virus) testing. Early in your pregnancy, you will be screened for HIV. If you are at high risk for HIV, this test may   be repeated during your third trimester of pregnancy.  You may be offered other testing based on your age, personal or family medical history, or other factors. HOW OFTEN SHOULD I PLAN TO SEE MY  HEALTH CARE PROVIDER FOR PRENATAL CARE? Your prenatal care check-up schedule depends on any medical conditions you have before, or develop during, your pregnancy. If you do not have any underlying medical conditions, you will likely be seen for checkups:  Monthly, during the first 6 months of pregnancy.  Twice a month during months 7 and 8 of pregnancy.  Weekly starting in the 9th month of pregnancy and until delivery.  If you develop signs of early labor or other concerning signs or symptoms, you may need to see your health care provider more often. Ask your health care provider what prenatal care schedule is best for you. WHAT CAN I DO TO KEEP MYSELF AND MY BABY AS HEALTHY AS POSSIBLE DURING MY PREGNANCY?  Take a prenatal vitamin containing 400 micrograms (0.4 mg) of folic acid every day. Your health care provider may also ask you to take additional vitamins such as iodine, vitamin D, iron, copper, and zinc.  Take 1500-2000 mg of calcium daily starting at your 20th week of pregnancy until you deliver your baby.  Make sure you are up to date on your vaccinations. Unless directed otherwise by your health care provider: ? You should receive a tetanus, diphtheria, and pertussis (Tdap) vaccination between the 27th and 36th week of your pregnancy, regardless of when your last Tdap immunization occurred. This helps protect your baby from whooping cough (pertussis) after he or she is born. ? You should receive an annual inactivated influenza vaccine (IIV) to help protect you and your baby from influenza. This can be done at any point during your pregnancy.  Eat a well-rounded diet that includes: ? Fresh fruits and vegetables. ? Lean proteins. ? Calcium-rich foods such as milk, yogurt, hard cheeses, and dark, leafy greens. ? Whole grain breads.  Do noteat seafood high in mercury, including: ? Swordfish. ? Tilefish. ? Shark. ? King mackerel. ? More than 6 oz tuna per week.  Do not  eat: ? Raw or undercooked meats or eggs. ? Unpasteurized foods, such as soft cheeses (brie, blue, or feta), juices, and milks. ? Lunch meats. ? Hot dogs that have not been heated until they are steaming.  Drink enough water to keep your urine clear or pale yellow. For many women, this may be 10 or more 8 oz glasses of water each day. Keeping yourself hydrated helps deliver nutrients to your baby and may prevent the start of pre-term uterine contractions.  Do not use any tobacco products including cigarettes, chewing tobacco, or electronic cigarettes. If you need help quitting, ask your health care provider.  Do not drink beverages containing alcohol. No safe level of alcohol consumption during pregnancy has been determined.  Do not use any illegal drugs. These can harm your developing baby or cause a miscarriage.  Ask your health care provider or pharmacist before taking any prescription or over-the-counter medicines, herbs, or supplements.  Limit your caffeine intake to no more than 200 mg per day.  Exercise. Unless told otherwise by your health care provider, try to get 30 minutes of moderate exercise most days of the week. Do not  do high-impact activities, contact sports, or activities with a high risk of falling, such as horseback riding or downhill skiing.  Get plenty of rest.  Avoid anything that raises your  body temperature, such as hot tubs and saunas.  If you own a cat, do not empty its litter box. Bacteria contained in cat feces can cause an infection called toxoplasmosis. This can result in serious harm to the fetus.  Stay away from chemicals such as insecticides, lead, mercury, and cleaning or paint products that contain solvents.  Do not have any X-rays taken unless medically necessary.  Take a childbirth and breastfeeding preparation class. Ask your health care provider if you need a referral or recommendation.  This information is not intended to replace advice given  to you by your health care provider. Make sure you discuss any questions you have with your health care provider. Document Released: 04/29/2003 Document Revised: 09/29/2015 Document Reviewed: 07/11/2013 Elsevier Interactive Patient Education  2017 [employer].   First Trimester of Pregnancy The first trimester of pregnancy is from week 1 until the end of week 13 (months 1 through 3). During this time, your baby will begin to develop inside you. At 6-8 weeks, the eyes and face are formed, and the heartbeat can be seen on ultrasound. At the end of 12 weeks, all the baby's organs are formed. Prenatal care is all the medical care you receive before the birth of your baby. Make sure you get good prenatal care and follow all of your doctor's instructions. Follow these instructions at home: Medicines  Take over-the-counter and prescription medicines only as told by your doctor. Some medicines are safe and some medicines are not safe during pregnancy.  Take a prenatal vitamin that contains at least 600 micrograms (mcg) of folic acid.  If you have trouble pooping (constipation), take medicine that will make your stool soft (stool softener) if your doctor approves. Eating and drinking  Eat regular, healthy meals.  Your doctor will tell you the amount of weight gain that is right for you.  Avoid raw meat and uncooked cheese.  If you feel sick to your stomach (nauseous) or throw up (vomit): ? Eat 4 or 5 small meals a day instead of 3 large meals. ? Try eating a few soda crackers. ? Drink liquids between meals instead of during meals.  To prevent constipation: ? Eat foods that are high in fiber, like fresh fruits and vegetables, whole grains, and beans. ? Drink enough fluids to keep your pee (urine) clear or pale yellow. Activity  Exercise only as told by your doctor. Stop exercising if you have cramps or pain in your lower belly (abdomen) or low back.  Do not exercise if it is too hot, too  humid, or if you are in a place of great height (high altitude).  Try to avoid standing for long periods of time. Move your legs often if you must stand in one place for a long time.  Avoid heavy lifting.  Wear low-heeled shoes. Sit and stand up straight.  You can have sex unless your doctor tells you not to. Relieving pain and discomfort  Wear a good support bra if your breasts are sore.  Take warm water baths (sitz baths) to soothe pain or discomfort caused by hemorrhoids. Use hemorrhoid cream if your doctor says it is okay.  Rest with your legs raised if you have leg cramps or low back pain.  If you have puffy, bulging veins (varicose veins) in your legs: ? Wear support hose or compression stockings as told by your doctor. ? Raise (elevate) your feet for 15 minutes, 3-4 times a day. ? Limit salt in your food.  Prenatal care  Schedule your prenatal visits by the twelfth week of pregnancy.  Write down your questions. Take them to your prenatal visits.  Keep all your prenatal visits as told by your doctor. This is important. Safety  Wear your seat belt at all times when driving.  Make a list of emergency phone numbers. The list should include numbers for family, friends, the hospital, and police and fire departments. General instructions  Ask your doctor for a referral to a local prenatal class. Begin classes no later than at the start of month 6 of your pregnancy.  Ask for help if you need counseling or if you need help with nutrition. Your doctor can give you advice or tell you where to go for help.  Do not use hot tubs, steam rooms, or saunas.  Do not douche or use tampons or scented sanitary pads.  Do not cross your legs for long periods of time.  Avoid all herbs and alcohol. Avoid drugs that are not approved by your doctor.  Do not use any tobacco products, including cigarettes, chewing tobacco, and electronic cigarettes. If you need help quitting, ask your doctor.  You may get counseling or other support to help you quit.  Avoid cat litter boxes and soil used by cats. These carry germs that can cause birth defects in the baby and can cause a loss of your baby (miscarriage) or stillbirth.  Visit your dentist. At home, brush your teeth with a soft toothbrush. Be gentle when you floss. Contact a doctor if:  You are dizzy.  You have mild cramps or pressure in your lower belly.  You have a nagging pain in your belly area.  You continue to feel sick to your stomach, you throw up, or you have watery poop (diarrhea).  You have a bad smelling fluid coming from your vagina.  You have pain when you pee (urinate).  You have increased puffiness (swelling) in your face, hands, legs, or ankles. Get help right away if:  You have a fever.  You are leaking fluid from your vagina.  You have spotting or bleeding from your vagina.  You have very bad belly cramping or pain.  You gain or lose weight rapidly.  You throw up blood. It may look like coffee grounds.  You are around people who have [place] measles, fifth disease, or chickenpox.  You have a very bad headache.  You have shortness of breath.  You have any kind of trauma, such as from a fall or a car accident. Summary  The first trimester of pregnancy is from week 1 until the end of week 13 (months 1 through 3).  To take care of yourself and your unborn baby, you will need to eat healthy meals, take medicines only if your doctor tells you to do so, and do activities that are safe for you and your baby.  Keep all follow-up visits as told by your doctor. This is important as your doctor will have to ensure that your baby is healthy and growing well. This information is not intended to replace advice given to you by your health care provider. Make sure you discuss any questions you have with your health care provider. Document Released: 10/13/2007 Document Revised: 05/04/2016 Document Reviewed:  05/04/2016 Elsevier Interactive Patient Education  2017 [employer]

## 2017-07-19 NOTE — Progress Notes (Signed)
DATING AND VIABILITY SONOGRAM   Melissa Galloway is a 27 y.o. year old 823P2002 with LMP Patient's last menstrual period was 05/22/2017 (exact date). which would correlate to  3833w0d weeks gestation.  She has regular menstrual cycles.   She is here today for a confirmatory initial sonogram.    GESTATION: SINGLETON     FETAL ACTIVITY:          Heart rate         162          The fetus is active.     ADNEXA: The ovaries are normal.   GESTATIONAL AGE AND  BIOMETRICS:  Gestational criteria: Estimated Date of Delivery: 03/07/18 by early ultrasound now at 6033w0d  Previous Scans:0  GESTATIONAL SAC        2.43 cm       7-1 weeks  CROWN RUMP LENGTH         0.973 cm        7-0weeks                                                                               AVERAGE EGA(BY THIS SCAN):  7-0 weeks  WORKING EDD( early ultrasound ):  03-07-18   Armandina StammerJennifer Cova Knieriem 07/19/2017 9:41 AM

## 2017-07-21 LAB — CULTURE, URINE COMPREHENSIVE

## 2017-07-22 LAB — CYTOLOGY - PAP
CHLAMYDIA, DNA PROBE: NEGATIVE
DIAGNOSIS: NEGATIVE
NEISSERIA GONORRHEA: NEGATIVE

## 2017-08-03 LAB — INHERITEST SOCIETY GUIDED

## 2017-08-03 LAB — OBSTETRIC PANEL, INCLUDING HIV
ANTIBODY SCREEN: NEGATIVE
BASOS: 0 %
Basophils Absolute: 0 10*3/uL (ref 0.0–0.2)
EOS (ABSOLUTE): 0.1 10*3/uL (ref 0.0–0.4)
Eos: 1 %
HEP B S AG: NEGATIVE
HIV SCREEN 4TH GENERATION: NONREACTIVE
Hematocrit: 37.6 % (ref 34.0–46.6)
Hemoglobin: 13.2 g/dL (ref 11.1–15.9)
Immature Grans (Abs): 0 10*3/uL (ref 0.0–0.1)
Immature Granulocytes: 0 %
LYMPHS ABS: 1.9 10*3/uL (ref 0.7–3.1)
Lymphs: 18 %
MCH: 30.4 pg (ref 26.6–33.0)
MCHC: 35.1 g/dL (ref 31.5–35.7)
MCV: 87 fL (ref 79–97)
Monocytes Absolute: 0.5 10*3/uL (ref 0.1–0.9)
Monocytes: 4 %
NEUTROS ABS: 8.3 10*3/uL — AB (ref 1.4–7.0)
Neutrophils: 77 %
Platelets: 320 10*3/uL (ref 150–379)
RBC: 4.34 x10E6/uL (ref 3.77–5.28)
RDW: 13 % (ref 12.3–15.4)
RPR: NONREACTIVE
Rh Factor: POSITIVE
Rubella Antibodies, IGG: 3.44 index (ref >0.99)
WBC: 10.8 10*3/uL (ref 3.4–10.8)

## 2017-08-03 LAB — HEMOGLOBIN A1C
Est. average glucose Bld gHb Est-mCnc: 111 mg/dL
Hgb A1c MFr Bld: 5.5 % (ref 4.8–5.6)

## 2017-08-03 LAB — HEMOGLOBINOPATHY EVALUATION
HEMOGLOBIN F QUANTITATION: 0 % (ref 0.0–2.0)
HGB C: 0 %
HGB S: 0 %
HGB VARIANT: 0 %
Hemoglobin A2 Quantitation: 2.1 % (ref 1.8–3.2)
Hgb A: 97.9 % (ref 96.4–98.8)

## 2017-08-17 ENCOUNTER — Encounter: Payer: Self-pay | Admitting: Obstetrics & Gynecology

## 2017-08-17 ENCOUNTER — Ambulatory Visit (INDEPENDENT_AMBULATORY_CARE_PROVIDER_SITE_OTHER): Payer: Medicaid Other | Admitting: Obstetrics & Gynecology

## 2017-08-17 ENCOUNTER — Other Ambulatory Visit: Payer: Self-pay | Admitting: Obstetrics & Gynecology

## 2017-08-17 VITALS — BP 109/66 | HR 75 | Wt 191.0 lb

## 2017-08-17 DIAGNOSIS — Z3481 Encounter for supervision of other normal pregnancy, first trimester: Secondary | ICD-10-CM

## 2017-08-17 DIAGNOSIS — Z3480 Encounter for supervision of other normal pregnancy, unspecified trimester: Secondary | ICD-10-CM

## 2017-08-17 NOTE — Progress Notes (Signed)
   PRENATAL VISIT NOTE  Subjective:  Melissa Galloway is a 27 y.o. G3P2002 at 80101w1d being seen today for ongoing prenatal care.  She is currently monitored for the following issues for this low-risk pregnancy and has Supervision of normal intrauterine pregnancy in multigravida in first trimester on their problem list.  Patient reports nausea.  Contractions: Not present. Vag. Bleeding: None.  Movement: Absent. Denies leaking of fluid.   The following portions of the patient's history were reviewed and updated as appropriate: allergies, current medications, past family history, past medical history, past social history, past surgical history and problem list. Problem list updated.  Objective:   Vitals:   08/17/17 1514  BP: 109/66  Pulse: 75  Weight: 191 lb (86.6 kg)    Fetal Status: Fetal Heart Rate (bpm): 172   Movement: Absent     General:  Alert, oriented and cooperative. Patient is in no acute distress.  Skin: Skin is warm and dry. No rash noted.   Cardiovascular: Normal heart rate noted  Respiratory: Normal respiratory effort, no problems with respiration noted  Abdomen: Soft, gravid, appropriate for gestational age.  Pain/Pressure: Absent     Pelvic: Cervical exam deferred        Extremities: Normal range of motion.  Edema: None  Mental Status: Normal mood and affect. Normal behavior. Normal judgment and thought content.   Assessment and Plan:  Pregnancy: G3P2002 at 20101w1d  1. Supervision of normal intrauterine pregnancy in multigravida in first trimester Rec ginger for the nausea Anatomy scan at 18-20 weeks Quad screen next visit  Preterm labor symptoms and general obstetric precautions including but not limited to vaginal bleeding, contractions, leaking of fluid and fetal movement were reviewed in detail with the patient. Please refer to After Visit Summary for other counseling recommendations.  Return in about 1 month (around 09/14/2017).  Future Appointments  Date  Time Provider Department Center  08/30/2017  9:00 AM [facility] US 3 [facility]  08/30/2017 10:00 AM [facility] LAB [facility]    Willodean Rosenthalarolyn Harraway-Smith, MD

## 2017-08-17 NOTE — Patient Instructions (Signed)
Second Trimester of Pregnancy The second trimester is from week 13 through week 28, month 4 through 6. This is often the time in pregnancy that you feel your best. Often times, morning sickness has lessened or quit. You may have more energy, and you may get hungry more often. Your unborn baby (fetus) is growing rapidly. At the end of the sixth month, he or she is about 9 inches long and weighs about 1 pounds. You will likely feel the baby move (quickening) between 18 and 20 weeks of pregnancy. Follow these instructions at home:  Avoid all smoking, herbs, and alcohol. Avoid drugs not approved by your doctor.  Do not use any tobacco products, including cigarettes, chewing tobacco, and electronic cigarettes. If you need help quitting, ask your doctor. You may get counseling or other support to help you quit.  Only take medicine as told by your doctor. Some medicines are safe and some are not during pregnancy.  Exercise only as told by your doctor. Stop exercising if you start having cramps.  Eat regular, healthy meals.  Wear a good support bra if your breasts are tender.  Do not use hot tubs, steam rooms, or saunas.  Wear your seat belt when driving.  Avoid raw meat, uncooked cheese, and liter boxes and soil used by cats.  Take your prenatal vitamins.  Take 1500-2000 milligrams of calcium daily starting at the 20th week of pregnancy until you deliver your baby.  Try taking medicine that helps you poop (stool softener) as needed, and if your doctor approves. Eat more fiber by eating fresh fruit, vegetables, and whole grains. Drink enough fluids to keep your pee (urine) clear or pale yellow.  Take warm water baths (sitz baths) to soothe pain or discomfort caused by hemorrhoids. Use hemorrhoid cream if your doctor approves.  If you have puffy, bulging veins (varicose veins), wear support hose. Raise (elevate) your feet for 15 minutes, 3-4 times a day. Limit salt in your diet.  Avoid heavy  lifting, wear low heals, and sit up straight.  Rest with your legs raised if you have leg cramps or low back pain.  Visit your dentist if you have not gone during your pregnancy. Use a soft toothbrush to brush your teeth. Be gentle when you floss.  You can have sex (intercourse) unless your doctor tells you not to.  Go to your doctor visits. Get help if:  You feel dizzy.  You have mild cramps or pressure in your lower belly (abdomen).  You have a nagging pain in your belly area.  You continue to feel sick to your stomach (nauseous), throw up (vomit), or have watery poop (diarrhea).  You have bad smelling fluid coming from your vagina.  You have pain with peeing (urination). Get help right away if:  You have a fever.  You are leaking fluid from your vagina.  You have spotting or bleeding from your vagina.  You have severe belly cramping or pain.  You lose or gain weight rapidly.  You have trouble catching your breath and have chest pain.  You notice sudden or extreme puffiness (swelling) of your face, hands, ankles, feet, or legs.  You have not felt the baby move in over an hour.  You have severe headaches that do not go away with medicine.  You have vision changes. This information is not intended to replace advice given to you by your health care provider. Make sure you discuss any questions you have with your health care   provider. Document Released: 07/21/2009 Document Revised: 10/02/2015 Document Reviewed: 06/27/2012 Elsevier Interactive Patient Education  2017 Elsevier Inc.  

## 2017-08-24 ENCOUNTER — Ambulatory Visit (INDEPENDENT_AMBULATORY_CARE_PROVIDER_SITE_OTHER): Payer: Medicaid Other | Admitting: Obstetrics & Gynecology

## 2017-08-24 ENCOUNTER — Other Ambulatory Visit (HOSPITAL_COMMUNITY)
Admission: RE | Admit: 2017-08-24 | Discharge: 2017-08-24 | Disposition: A | Discharge status: Home / Self Care | Payer: Medicaid Other | Source: Ambulatory Visit | Attending: Obstetrics & Gynecology | Admitting: Obstetrics & Gynecology

## 2017-08-24 ENCOUNTER — Encounter: Payer: Self-pay | Admitting: Obstetrics & Gynecology

## 2017-08-24 VITALS — BP 116/72 | HR 86 | Wt 190.0 lb

## 2017-08-24 DIAGNOSIS — B373 Candidiasis of vulva and vagina: Secondary | ICD-10-CM | POA: Insufficient documentation

## 2017-08-24 DIAGNOSIS — N949 Unspecified condition associated with female genital organs and menstrual cycle: Secondary | ICD-10-CM | POA: Diagnosis not present

## 2017-08-24 DIAGNOSIS — Z331 Pregnant state, incidental: Secondary | ICD-10-CM | POA: Diagnosis not present

## 2017-08-24 DIAGNOSIS — B3731 Acute candidiasis of vulva and vagina: Secondary | ICD-10-CM

## 2017-08-24 DIAGNOSIS — R309 Painful micturition, unspecified: Secondary | ICD-10-CM

## 2017-08-24 LAB — POCT URINALYSIS DIPSTICK
BILIRUBIN UA: NEGATIVE
GLUCOSE UA: NEGATIVE
Nitrite, UA: NEGATIVE
PH UA: 5 (ref 5.0–8.0)
Spec Grav, UA: 1.02 (ref 1.010–1.025)
Urobilinogen, UA: 0.2 E.U./dL

## 2017-08-24 MED ORDER — TERCONAZOLE 0.4 % VA CREA: 1.0000 | TOPICAL_CREAM | Freq: Every day | VAGINAL | 0 refills | Status: DC

## 2017-08-24 NOTE — Progress Notes (Signed)
Pt c/o skin around her vagina being raw. No discharge or odor. Pt states she did use a different soap than she normally does one time last week. Used Monistat one time

## 2017-08-24 NOTE — Progress Notes (Signed)
History:  27 y.o. [id number] here today for painful vagina. She  Report that this happened with each pregnancy. She has had sx for 4 days. She finally took OTC Monistat last night with no relief of sx.  She denies discharge. She has not been on atbx and does not take baths.   The following portions of the patient's history were reviewed and updated as appropriate: allergies, current medications, past family history, past medical history, past social history, past surgical history and problem list.  Review of Systems:  Pertinent items are noted in HPI.    Objective:  Physical Exam Blood pressure 116/72, pulse 86, weight 190 lb (86.2 kg), last menstrual period 05/22/2017, unknown if currently breastfeeding.  CONSTITUTIONAL: Well-developed, well-nourished female in no acute distress.  HENT:  Normocephalic, atraumatic EYES: Conjunctivae and EOM are normal. No scleral icterus.  NECK: Normal range of motion SKIN: Skin is warm and dry. No rash noted. Not diaphoretic.No pallor. NEUROLGIC: Alert and oriented to person, place, and time. Normal coordination.  Abd: Soft, nontender and nondistended +170's Pelvic: external genitalia- labia minus are bilaterally swollen. There are no lesions. The discharge is thick with copious amounts of cottage cheese discharge- it is challenging to tell what is discharge and what is medication      Assessment & Plan:  10 we IUP with suspected yeast vaginitis.  No pregnancy concerns  Terazol 7- intravaginally nightly for 7 days F/u Affirm F/u sooner prn F/u as scheduled for routine OB care  Hines Kloss L. Harraway-Smith, M.D., [facility]

## 2017-08-26 LAB — CERVICOVAGINAL ANCILLARY ONLY
Bacterial vaginitis: NEGATIVE
CANDIDA VAGINITIS: POSITIVE — AB

## 2017-08-29 ENCOUNTER — Other Ambulatory Visit: Payer: Self-pay | Admitting: Obstetrics & Gynecology

## 2017-08-30 ENCOUNTER — Ambulatory Visit (HOSPITAL_COMMUNITY)
Admission: RE | Admit: 2017-08-30 | Discharge: 2017-08-30 | Disposition: A | Discharge status: Home / Self Care | Payer: Medicaid Other | Source: Ambulatory Visit | Attending: Advanced Practice Midwife | Admitting: Advanced Practice Midwife

## 2017-08-30 ENCOUNTER — Encounter (HOSPITAL_COMMUNITY): Payer: Self-pay

## 2017-08-30 ENCOUNTER — Ambulatory Visit (HOSPITAL_COMMUNITY): Payer: Medicaid Other

## 2017-08-30 ENCOUNTER — Other Ambulatory Visit: Payer: Self-pay | Admitting: Advanced Practice Midwife

## 2017-08-30 DIAGNOSIS — Z3682 Encounter for antenatal screening for nuchal translucency: Secondary | ICD-10-CM

## 2017-08-30 DIAGNOSIS — Z3A13 13 weeks gestation of pregnancy: Secondary | ICD-10-CM

## 2017-08-30 DIAGNOSIS — Z3481 Encounter for supervision of other normal pregnancy, first trimester: Secondary | ICD-10-CM

## 2017-09-05 ENCOUNTER — Other Ambulatory Visit: Payer: Self-pay | Admitting: Advanced Practice Midwife

## 2017-09-05 ENCOUNTER — Other Ambulatory Visit: Payer: Self-pay

## 2017-09-07 ENCOUNTER — Other Ambulatory Visit: Payer: Self-pay

## 2017-09-22 ENCOUNTER — Ambulatory Visit (INDEPENDENT_AMBULATORY_CARE_PROVIDER_SITE_OTHER): Payer: Medicaid Other | Admitting: Family Medicine

## 2017-09-22 VITALS — BP 109/70 | HR 106 | Wt 187.4 lb

## 2017-09-22 DIAGNOSIS — R110 Nausea: Secondary | ICD-10-CM

## 2017-09-22 DIAGNOSIS — J45909 Unspecified asthma, uncomplicated: Secondary | ICD-10-CM

## 2017-09-22 DIAGNOSIS — O99519 Diseases of the respiratory system complicating pregnancy, unspecified trimester: Secondary | ICD-10-CM

## 2017-09-22 DIAGNOSIS — O99512 Diseases of the respiratory system complicating pregnancy, second trimester: Secondary | ICD-10-CM

## 2017-09-22 DIAGNOSIS — Z3480 Encounter for supervision of other normal pregnancy, unspecified trimester: Secondary | ICD-10-CM

## 2017-09-22 DIAGNOSIS — Z3482 Encounter for supervision of other normal pregnancy, second trimester: Secondary | ICD-10-CM

## 2017-09-22 MED ORDER — ALBUTEROL SULFATE HFA 108 (90 BASE) MCG/ACT IN AERS: 2.0000 | INHALATION_SPRAY | Freq: Four times a day (QID) | RESPIRATORY_TRACT | 6 refills | Status: DC | PRN

## 2017-09-22 MED ORDER — DOXYLAMINE-PYRIDOXINE 10-10 MG PO TBEC: 2.0000 | DELAYED_RELEASE_TABLET | Freq: Every day | ORAL | 5 refills | Status: DC

## 2017-09-22 NOTE — Progress Notes (Signed)
   PRENATAL VISIT NOTE  Subjective:  Melissa Galloway is a 27 y.o. G3P2002 at [redacted]w[redacted]d being seen today for ongoing prenatal care.  She is currently monitored for the following issues for this low-risk pregnancy and has Supervision of normal intrauterine pregnancy in multigravida in first trimester and Asthma affecting pregnancy, antepartum on their problem list.  Patient reports using albuterol 2-3 times a day. Feels allergies are part of difficulty. Lack of appetite and nausea. .  Contractions: Not present. Vag. Bleeding: None.  Movement: Present. Denies leaking of fluid.   The following portions of the patient's history were reviewed and updated as appropriate: allergies, current medications, past family history, past medical history, past social history, past surgical history and problem list. Problem list updated.  Objective:   Vitals:   09/22/17 0820  BP: 109/70  Pulse: (!) 106  Weight: 187 lb 6.4 oz (85 kg)    Fetal Status: Fetal Heart Rate (bpm): 156   Movement: Present     General:  Alert, oriented and cooperative. Patient is in no acute distress.  Skin: Skin is warm and dry. No rash noted.   Cardiovascular: Normal heart rate noted  Respiratory: Normal respiratory effort, no problems with respiration noted  Abdomen: Soft, gravid, appropriate for gestational age.  Pain/Pressure: Absent     Pelvic: Cervical exam deferred        Extremities: Normal range of motion.  Edema: None  Mental Status: Normal mood and affect. Normal behavior. Normal judgment and thought content.   Assessment and Plan:  Pregnancy: G3P2002 at [redacted]w[redacted]d  1. Supervision of other normal pregnancy, antepartum FHT normal. [place] 6/4. AFP today  2. Asthma affecting pregnancy, antepartum Refill albuterol HFA. Discussed allergy control with oral antihistamine. If this doesn't control symptoms, instructed her to send message so we can start LABA or ICH. May also benefit from Singulair, especially if has allergic  component.  3. Nausea diclegis prescribed. Discussed keeping something on stomach.  Preterm labor symptoms and general obstetric precautions including but not limited to vaginal bleeding, contractions, leaking of fluid and fetal movement were reviewed in detail with the patient. Please refer to After Visit Summary for other counseling recommendations.  Return in about 1 month (around 10/20/2017) for OB f/u.  Future Appointments  Date Time Provider Department Center  10/11/2017 10:15 AM [facility] [place] 4 [facility]    Levie Heritage, DO

## 2017-09-24 LAB — AFP TETRA
DIA Mom Value: 1.16
DIA Value (EIA): 178.37 pg/mL
DSR (By Age)    1 IN: 724
DSR (Second Trimester) 1 IN: 3995
Gestational Age: 16.2 WEEKS
MSAFP MOM: 1.13
MSAFP: 35.9 ng/mL
MSHCG MOM: 1.26
MSHCG: 44929 m[IU]/mL
Maternal Age At EDD: 29.7 yr
Osb Risk: 10000
Test Results:: NEGATIVE
UE3 MOM: 1.06
UE3 VALUE: 0.87 ng/mL
Weight: 187 [lb_av]

## 2017-10-11 ENCOUNTER — Other Ambulatory Visit: Payer: Self-pay | Admitting: Obstetrics & Gynecology

## 2017-10-11 ENCOUNTER — Ambulatory Visit (HOSPITAL_COMMUNITY)
Admission: RE | Admit: 2017-10-11 | Discharge: 2017-10-11 | Disposition: A | Discharge status: Home / Self Care | Payer: Medicaid Other | Source: Ambulatory Visit | Attending: Obstetrics & Gynecology | Admitting: Obstetrics & Gynecology

## 2017-10-11 DIAGNOSIS — Z363 Encounter for antenatal screening for malformations: Secondary | ICD-10-CM | POA: Diagnosis present

## 2017-10-11 DIAGNOSIS — Z3480 Encounter for supervision of other normal pregnancy, unspecified trimester: Secondary | ICD-10-CM

## 2017-10-11 DIAGNOSIS — Z3A19 19 weeks gestation of pregnancy: Secondary | ICD-10-CM

## 2017-10-13 ENCOUNTER — Encounter: Payer: Self-pay | Admitting: Obstetrics & Gynecology

## 2017-10-17 ENCOUNTER — Encounter: Payer: Self-pay | Admitting: Obstetrics & Gynecology

## 2017-10-18 ENCOUNTER — Telehealth: Payer: Self-pay

## 2017-10-18 NOTE — Telephone Encounter (Signed)
Patient is 20 weeks and  states that yesterday at work she had a lot of anxiety and cried a lot. Patient states that after that in the evening her stomach started cramping and she was having a lot of pressure. Patient states that she went to the emergency room at [facility] and sat there from 7p to 3am and was not evaluated. Patient states she is still feeling vaginal pressure.   Patient instructed that there is not a provider in the office today and she needs to be evaluated. [facility] and given address. Patient states she knows where it is located and has a ride to get there. Armandina StammerJennifer Howard RN

## 2017-10-20 ENCOUNTER — Ambulatory Visit (INDEPENDENT_AMBULATORY_CARE_PROVIDER_SITE_OTHER): Payer: Medicaid Other | Admitting: Obstetrics & Gynecology

## 2017-10-20 VITALS — BP 115/75 | HR 105 | Wt 187.0 lb

## 2017-10-20 DIAGNOSIS — Z3481 Encounter for supervision of other normal pregnancy, first trimester: Secondary | ICD-10-CM

## 2017-10-20 MED ORDER — RANITIDINE HCL 150 MG PO TABS: 150.0000 mg | ORAL_TABLET | Freq: Every day | ORAL | 6 refills | Status: DC

## 2017-10-20 NOTE — Progress Notes (Signed)
   PRENATAL VISIT NOTE  Subjective:  Melissa Galloway is a 27 y.o. single G3P2002 at 7535w2d being seen today for ongoing prenatal care.  She is currently monitored for the following issues for this low-risk pregnancy and has Supervision of normal intrauterine pregnancy in multigravida in first trimester; Asthma affecting pregnancy, antepartum; Encounter for antenatal screening for malformations; and [redacted] weeks gestation of pregnancy on their problem list.  Patient reports heartburn.  Contractions: Not present. Vag. Bleeding: None.  Movement: Present. Denies leaking of fluid.   The following portions of the patient's history were reviewed and updated as appropriate: allergies, current medications, past family history, past medical history, past social history, past surgical history and problem list. Problem list updated.  Objective:   Vitals:   10/20/17 0813  BP: 115/75  Pulse: (!) 105  Weight: 187 lb (84.8 kg)    Fetal Status: Fetal Heart Rate (bpm): 155   Movement: Present     General:  Alert, oriented and cooperative. Patient is in no acute distress.  Skin: Skin is warm and dry. No rash noted.   Cardiovascular: Normal heart rate noted  Respiratory: Normal respiratory effort, no problems with respiration noted  Abdomen: Soft, gravid, appropriate for gestational age.  Pain/Pressure: Present     Pelvic: Cervical exam deferred        Extremities: Normal range of motion.  Edema: None  Mental Status: Normal mood and affect. Normal behavior. Normal judgment and thought content.   Assessment and Plan:  Pregnancy: G3P2002 at 2235w2d  1. Supervision of normal intrauterine pregnancy in multigravida in first trimester Zantac  Preterm labor symptoms and general obstetric precautions including but not limited to vaginal bleeding, contractions, leaking of fluid and fetal movement were reviewed in detail with the patient. Please refer to After Visit Summary for other counseling recommendations.    Return in about 1 month (around 11/17/2017).  No future appointments.  Allie BossierMyra C Aimie Wagman, MD

## 2017-11-15 ENCOUNTER — Encounter: Payer: Self-pay | Admitting: Advanced Practice Midwife

## 2017-11-17 ENCOUNTER — Other Ambulatory Visit: Payer: Self-pay

## 2017-11-24 ENCOUNTER — Ambulatory Visit (INDEPENDENT_AMBULATORY_CARE_PROVIDER_SITE_OTHER): Payer: Medicaid Other | Admitting: Family Medicine

## 2017-11-24 VITALS — BP 109/67 | HR 105 | Wt 188.1 lb

## 2017-11-24 DIAGNOSIS — Z23 Encounter for immunization: Secondary | ICD-10-CM | POA: Diagnosis not present

## 2017-11-24 DIAGNOSIS — Z3480 Encounter for supervision of other normal pregnancy, unspecified trimester: Secondary | ICD-10-CM

## 2017-11-24 DIAGNOSIS — Z3481 Encounter for supervision of other normal pregnancy, first trimester: Secondary | ICD-10-CM

## 2017-11-24 NOTE — Progress Notes (Signed)
   PRENATAL VISIT NOTE  Subjective:  Melissa Galloway is a 27 y.o. G3P2002 at 8486w2d being seen today for ongoing prenatal care.  She is currently monitored for the following issues for this low-risk pregnancy and has Supervision of normal intrauterine pregnancy in multigravida in first trimester; Asthma affecting pregnancy, antepartum; Encounter for antenatal screening for malformations; and [redacted] weeks gestation of pregnancy on their problem list.  Patient reports no complaints.  Contractions: Not present. Vag. Bleeding: None.  Movement: Present. Denies leaking of fluid.   The following portions of the patient's history were reviewed and updated as appropriate: allergies, current medications, past family history, past medical history, past social history, past surgical history and problem list. Problem list updated.  Objective:   Vitals:   11/24/17 0954  BP: 109/67  Pulse: (!) 105  Weight: 188 lb 1.9 oz (85.3 kg)    Fetal Status: Fetal Heart Rate (bpm): 143   Movement: Present     General:  Alert, oriented and cooperative. Patient is in no acute distress.  Skin: Skin is warm and dry. No rash noted.   Cardiovascular: Normal heart rate noted  Respiratory: Normal respiratory effort, no problems with respiration noted  Abdomen: Soft, gravid, appropriate for gestational age.  Pain/Pressure: Present     Pelvic: Cervical exam deferred        Extremities: Normal range of motion.  Edema: None  Mental Status: Normal mood and affect. Normal behavior. Normal judgment and thought content.   Assessment and Plan:  Pregnancy: G3P2002 at 3386w2d  1. Supervision of other normal pregnancy, antepartum FOB has [place] trait. Patient does not have [place] trait (MFM US report erroneously documented that the patient had [place] trait, but patient had normal hemoglobinopathy done at beginning of pregnancy). - CBC - Glucose Tolerance, 2 Hours w/1 Hour - HIV antibody - RPR - Tdap vaccine greater than or equal to 7yo  IM  2. Supervision of normal intrauterine pregnancy in multigravida in first trimester   Preterm labor symptoms and general obstetric precautions including but not limited to vaginal bleeding, contractions, leaking of fluid and fetal movement were reviewed in detail with the patient. Please refer to After Visit Summary for other counseling recommendations.  Return in about 1 month (around 12/22/2017).  Future Appointments  Date Time Provider Department Center  12/22/2017  9:30 AM Levie HeritageStinson, Jacob J, DO [facility] None    Levie HeritageJacob J Stinson, DO

## 2017-11-25 ENCOUNTER — Encounter (INDEPENDENT_AMBULATORY_CARE_PROVIDER_SITE_OTHER): Payer: Self-pay

## 2017-11-25 LAB — CBC
Hematocrit: 34.9 % (ref 34.0–46.6)
Hemoglobin: 11.8 g/dL (ref 11.1–15.9)
MCH: 30 pg (ref 26.6–33.0)
MCHC: 33.8 g/dL (ref 31.5–35.7)
MCV: 89 fL (ref 79–97)
PLATELETS: 275 10*3/uL (ref 150–450)
RBC: 3.93 x10E6/uL (ref 3.77–5.28)
RDW: 12.2 % — ABNORMAL LOW (ref 12.3–15.4)
WBC: 9.4 10*3/uL (ref 3.4–10.8)

## 2017-11-25 LAB — GLUCOSE TOLERANCE, 2 HOURS W/ 1HR
GLUCOSE, 1 HOUR: 145 mg/dL (ref 65–179)
Glucose, 2 hour: 103 mg/dL (ref 65–152)
Glucose, Fasting: 77 mg/dL (ref 65–91)

## 2017-11-25 LAB — RPR: RPR Ser Ql: NONREACTIVE

## 2017-11-25 LAB — HIV ANTIBODY (ROUTINE TESTING W REFLEX): HIV SCREEN 4TH GENERATION: NONREACTIVE

## 2017-12-08 ENCOUNTER — Encounter: Payer: Self-pay | Admitting: Family Medicine

## 2017-12-22 ENCOUNTER — Encounter: Payer: Self-pay | Admitting: Family Medicine

## 2017-12-27 ENCOUNTER — Ambulatory Visit (INDEPENDENT_AMBULATORY_CARE_PROVIDER_SITE_OTHER): Payer: Medicaid Other | Admitting: Advanced Practice Midwife

## 2017-12-27 VITALS — BP 111/71 | HR 99 | Wt 190.0 lb

## 2017-12-27 DIAGNOSIS — Z3480 Encounter for supervision of other normal pregnancy, unspecified trimester: Secondary | ICD-10-CM

## 2017-12-28 ENCOUNTER — Encounter: Payer: Self-pay | Admitting: Advanced Practice Midwife

## 2017-12-28 NOTE — Patient Instructions (Signed)

## 2017-12-28 NOTE — Progress Notes (Signed)
   PRENATAL VISIT NOTE  Subjective:  Melissa Galloway is a 27 y.o. G3P2002 at 6570w1d being seen today for ongoing prenatal care.  She is currently monitored for the following issues for this low-risk pregnancy and has Supervision of normal intrauterine pregnancy in multigravida in first trimester; Asthma affecting pregnancy, antepartum; Encounter for antenatal screening for malformations; and [redacted] weeks gestation of pregnancy on their problem list.  Patient reports no complaints.  Contractions: Not present. Vag. Bleeding: None.  Movement: Present. Denies leaking of fluid.   The following portions of the patient's history were reviewed and updated as appropriate: allergies, current medications, past family history, past medical history, past social history, past surgical history and problem list. Problem list updated.  Objective:   Vitals:   12/27/17 0845  BP: 111/71  Pulse: 99  Weight: 86.2 kg    Fetal Status: Fetal Heart Rate (bpm): 141 Fundal Height: 30 cm Movement: Present     General:  Alert, oriented and cooperative. Patient is in no acute distress.  Skin: Skin is warm and dry. No rash noted.   Cardiovascular: Normal heart rate noted  Respiratory: Normal respiratory effort, no problems with respiration noted  Abdomen: Soft, gravid, appropriate for gestational age.  Pain/Pressure: Present     Pelvic: Cervical exam deferred        Extremities: Normal range of motion.  Edema: Trace  Mental Status: Normal mood and affect. Normal behavior. Normal judgment and thought content.   Assessment and Plan:  Pregnancy: G3P2002 at 1470w1d  1. Supervision of normal intrauterine pregnancy in multigravida in third trimester    Reviewed glucola was normal     Reviewed signs to report and MAU available  Preterm labor symptoms and general obstetric precautions including but not limited to vaginal bleeding, contractions, leaking of fluid and fetal movement were reviewed in detail with the  patient. Please refer to After Visit Summary for other counseling recommendations.    Future Appointments  Date Time Provider Department Center  01/17/2018 10:30 AM Aviva SignsWilliams, Leverne Tessler L, CNM [facility] None  01/31/2018 10:45 AM Aviva SignsWilliams, Kollin Udell L, CNM [facility] None    Wynelle BourgeoisMarie Teresea Donley, CNM

## 2017-12-28 NOTE — Progress Notes (Deleted)
   PRENATAL VISIT NOTE  Subjective:  Melissa Galloway is a 27 y.o. G3P2002 at 6478w1d being seen today for ongoing prenatal care.  She is currently monitored for the following issues for this {Blank single:19197::"high-risk","low-risk"} pregnancy and has Supervision of normal intrauterine pregnancy in multigravida in first trimester; Asthma affecting pregnancy, antepartum; Encounter for antenatal screening for malformations; and [redacted] weeks gestation of pregnancy on their problem list.  Patient reports {sx:14538}.  Contractions: Not present. Vag. Bleeding: None.  Movement: Present. Denies leaking of fluid.   The following portions of the patient's history were reviewed and updated as appropriate: allergies, current medications, past family history, past medical history, past social history, past surgical history and problem list. Problem list updated.  Objective:   Vitals:   12/27/17 0845  BP: 111/71  Pulse: 99  Weight: 86.2 kg    Fetal Status: Fetal Heart Rate (bpm): 141 Fundal Height: 30 cm Movement: Present     General:  Alert, oriented and cooperative. Patient is in no acute distress.  Skin: Skin is warm and dry. No rash noted.   Cardiovascular: Normal heart rate noted  Respiratory: Normal respiratory effort, no problems with respiration noted  Abdomen: Soft, gravid, appropriate for gestational age.  Pain/Pressure: Present     Pelvic: {Blank single:19197::"Cervical exam performed","Cervical exam deferred"}        Extremities: Normal range of motion.  Edema: Trace  Mental Status: Normal mood and affect. Normal behavior. Normal judgment and thought content.   Assessment and Plan:  Pregnancy: G3P2002 at 6978w1d  1. Supervision of normal intrauterine pregnancy in multigravida in first trimester ***  {Blank single:19197::"Term","Preterm"} labor symptoms and general obstetric precautions including but not limited to vaginal bleeding, contractions, leaking of fluid and fetal movement were  reviewed in detail with the patient. Please refer to After Visit Summary for other counseling recommendations.  No follow-ups on file.  Future Appointments  Date Time Provider Department Center  01/17/2018 10:30 AM Aviva SignsWilliams, Dolan Xia L, CNM [facility] None  01/31/2018 10:45 AM Aviva SignsWilliams, Pernella Ackerley L, CNM [facility] None    Wynelle BourgeoisMarie Murrel Bertram, CNM

## 2018-01-17 ENCOUNTER — Ambulatory Visit (INDEPENDENT_AMBULATORY_CARE_PROVIDER_SITE_OTHER): Payer: Medicaid Other | Admitting: Advanced Practice Midwife

## 2018-01-17 VITALS — BP 99/61 | HR 92 | Wt 189.0 lb

## 2018-01-17 DIAGNOSIS — Z3483 Encounter for supervision of other normal pregnancy, third trimester: Secondary | ICD-10-CM

## 2018-01-17 DIAGNOSIS — Z3480 Encounter for supervision of other normal pregnancy, unspecified trimester: Secondary | ICD-10-CM

## 2018-01-18 ENCOUNTER — Encounter: Payer: Self-pay | Admitting: Advanced Practice Midwife

## 2018-01-18 NOTE — Progress Notes (Signed)
   PRENATAL VISIT NOTE  Subjective:  Melissa Galloway is a 27 y.o. G3P2002 at [redacted]w[redacted]d being seen today for ongoing prenatal care.  She is currently monitored for the following issues for this low-risk pregnancy and has Supervision of normal intrauterine pregnancy in multigravida in first trimester; Asthma; Encounter for antenatal screening for malformations; and [redacted] weeks gestation of pregnancy on their problem list.  Patient reports no complaints.  Contractions: Not present. Vag. Bleeding: None.  Movement: Present. Denies leaking of fluid.   The following portions of the patient's history were reviewed and updated as appropriate: allergies, current medications, past family history, past medical history, past social history, past surgical history and problem list. Problem list updated.  Objective:   Vitals:   01/17/18 1041  BP: 99/61  Pulse: 92  Weight: 85.7 kg    Fetal Status: Fetal Heart Rate (bpm): 147 Fundal Height: 33 cm Movement: Present     General:  Alert, oriented and cooperative. Patient is in no acute distress.  Skin: Skin is warm and dry. No rash noted.   Cardiovascular: Normal heart rate noted  Respiratory: Normal respiratory effort, no problems with respiration noted  Abdomen: Soft, gravid, appropriate for gestational age.  Pain/Pressure: Present     Pelvic: Cervical exam deferred        Extremities: Normal range of motion.  Edema: Trace  Mental Status: Normal mood and affect. Normal behavior. Normal judgment and thought content.   Assessment and Plan:  Pregnancy: G3P2002 at [redacted]w[redacted]d  There are no diagnoses linked to this encounter. Preterm labor symptoms and general obstetric precautions including but not limited to vaginal bleeding, contractions, leaking of fluid and fetal movement were reviewed in detail with the patient. Please refer to After Visit Summary for other counseling recommendations.    Future Appointments  Date Time Provider Department Center  01/31/2018  10:45 AM Aviva Signs, CNM [facility] None    Wynelle Bourgeois, CNM

## 2018-01-18 NOTE — Patient Instructions (Signed)

## 2018-01-31 ENCOUNTER — Ambulatory Visit (INDEPENDENT_AMBULATORY_CARE_PROVIDER_SITE_OTHER): Payer: Medicaid Other | Admitting: Advanced Practice Midwife

## 2018-01-31 VITALS — BP 107/64 | HR 103 | Wt 187.0 lb

## 2018-01-31 DIAGNOSIS — Z3480 Encounter for supervision of other normal pregnancy, unspecified trimester: Secondary | ICD-10-CM

## 2018-01-31 DIAGNOSIS — Z3483 Encounter for supervision of other normal pregnancy, third trimester: Secondary | ICD-10-CM

## 2018-01-31 MED ORDER — PANTOPRAZOLE SODIUM 20 MG PO TBEC: 20.0000 mg | DELAYED_RELEASE_TABLET | Freq: Every day | ORAL | 0 refills | Status: DC

## 2018-02-02 ENCOUNTER — Encounter: Payer: Self-pay | Admitting: Advanced Practice Midwife

## 2018-02-02 MED ORDER — NATACHEW 28-1 MG PO CHEW: 1.0000 | CHEWABLE_TABLET | Freq: Every day | ORAL | 12 refills | Status: DC

## 2018-02-02 NOTE — Patient Instructions (Signed)

## 2018-02-02 NOTE — Addendum Note (Signed)
Addended by: Aviva Signs on: 02/02/2018 02:07 AM   Modules accepted: Orders

## 2018-02-02 NOTE — Progress Notes (Addendum)
   PRENATAL VISIT NOTE  Subjective:  Melissa Galloway is a 27 y.o. G3P2002 at [redacted]w[redacted]d being seen today for ongoing prenatal care.  She is currently monitored for the following issues for this low-risk pregnancy and has Supervision of normal intrauterine pregnancy in multigravida in first trimester; Asthma; and Encounter for antenatal screening for malformations on their problem list.  Patient reports Heartburn unrelieved by Zantac and wants a chewable vitamin.  Contractions: Irregular. Vag. Bleeding: None.  Movement: Present. Denies leaking of fluid.   The following portions of the patient's history were reviewed and updated as appropriate: allergies, current medications, past family history, past medical history, past social history, past surgical history and problem list. Problem list updated.  Objective:   Vitals:   01/31/18 1053  BP: 107/64  Pulse: (!) 103  Weight: 84.8 kg    Fetal Status: Fetal Heart Rate (bpm): 143 Fundal Height: 35 cm Movement: Present     General:  Alert, oriented and cooperative. Patient is in no acute distress.  Skin: Skin is warm and dry. No rash noted.   Cardiovascular: Normal heart rate noted  Respiratory: Normal respiratory effort, no problems with respiration noted  Abdomen: Soft, gravid, appropriate for gestational age.  Pain/Pressure: Present     Pelvic: Cervical exam deferred        Extremities: Normal range of motion.  Edema: Trace  Mental Status: Normal mood and affect. Normal behavior. Normal judgment and thought content.   Assessment and Plan:  Pregnancy: G3P2002 at [redacted]w[redacted]d                       GBS and cultures next visit                        Rx Protonix for acid reflux                       Rx Natachews  Preterm labor symptoms and general obstetric precautions including but not limited to vaginal bleeding, contractions, leaking of fluid and fetal movement were reviewed in detail with the patient. Please refer to After Visit Summary for  other counseling recommendations.    Future Appointments  Date Time Provider Department Center  02/14/2018 10:45 AM Aviva Signs, CNM [facility] None    Wynelle Bourgeois, CNM

## 2018-02-06 DIAGNOSIS — O26899 Other specified pregnancy related conditions, unspecified trimester: Secondary | ICD-10-CM | POA: Insufficient documentation

## 2018-02-06 DIAGNOSIS — R1011 Right upper quadrant pain: Secondary | ICD-10-CM | POA: Insufficient documentation

## 2018-02-06 DIAGNOSIS — R109 Unspecified abdominal pain: Secondary | ICD-10-CM

## 2018-02-06 DIAGNOSIS — Z3A37 37 weeks gestation of pregnancy: Secondary | ICD-10-CM | POA: Insufficient documentation

## 2018-02-14 ENCOUNTER — Ambulatory Visit (INDEPENDENT_AMBULATORY_CARE_PROVIDER_SITE_OTHER): Payer: Medicaid Other | Admitting: Advanced Practice Midwife

## 2018-02-14 ENCOUNTER — Other Ambulatory Visit (HOSPITAL_COMMUNITY)
Admission: RE | Admit: 2018-02-14 | Discharge: 2018-02-14 | Disposition: A | Discharge status: Home / Self Care | Payer: Medicaid Other | Source: Ambulatory Visit | Attending: Advanced Practice Midwife | Admitting: Advanced Practice Midwife

## 2018-02-14 ENCOUNTER — Encounter: Payer: Self-pay | Admitting: Advanced Practice Midwife

## 2018-02-14 VITALS — BP 108/67 | HR 106 | Wt 195.0 lb

## 2018-02-14 DIAGNOSIS — Z3481 Encounter for supervision of other normal pregnancy, first trimester: Secondary | ICD-10-CM

## 2018-02-14 DIAGNOSIS — Z3A37 37 weeks gestation of pregnancy: Secondary | ICD-10-CM | POA: Diagnosis not present

## 2018-02-14 DIAGNOSIS — Z3483 Encounter for supervision of other normal pregnancy, third trimester: Secondary | ICD-10-CM

## 2018-02-14 DIAGNOSIS — Z3480 Encounter for supervision of other normal pregnancy, unspecified trimester: Secondary | ICD-10-CM

## 2018-02-14 LAB — OB RESULTS CONSOLE GBS: STREP GROUP B AG: POSITIVE

## 2018-02-14 LAB — OB RESULTS CONSOLE GC/CHLAMYDIA: Gonorrhea: NEGATIVE

## 2018-02-14 NOTE — Patient Instructions (Signed)

## 2018-02-15 LAB — GC/CHLAMYDIA PROBE AMP (~~LOC~~) NOT AT ARMC
CHLAMYDIA, DNA PROBE: NEGATIVE
Neisseria Gonorrhea: NEGATIVE

## 2018-02-16 ENCOUNTER — Encounter: Payer: Self-pay | Admitting: Advanced Practice Midwife

## 2018-02-16 NOTE — Progress Notes (Signed)
   PRENATAL VISIT NOTE  Subjective:  Melissa Galloway is a 27 y.o. G3P2002 at [redacted]w[redacted]d being seen today for ongoing prenatal care.  She is currently monitored for the following issues for this low-risk pregnancy and has Supervision of normal intrauterine pregnancy in multigravida in first trimester; Asthma; Encounter for antenatal screening for malformations; Abdominal pain affecting pregnancy; Pregnancy with 37 weeks completed gestation; and Right upper quadrant abdominal pain on their problem list.  Patient reports occasional contractions.  Contractions: Irregular. Vag. Bleeding: None.  Movement: Present. Denies leaking of fluid.   The following portions of the patient's history were reviewed and updated as appropriate: allergies, current medications, past family history, past medical history, past social history, past surgical history and problem list. Problem list updated.  Objective:   Vitals:   02/14/18 1106  BP: 108/67  Pulse: (!) 106  Weight: 88.5 kg    Fetal Status: Fetal Heart Rate (bpm): 148 Fundal Height: 36 cm Movement: Present  Presentation: Vertex  General:  Alert, oriented and cooperative. Patient is in no acute distress.  Skin: Skin is warm and dry. No rash noted.   Cardiovascular: Normal heart rate noted  Respiratory: Normal respiratory effort, no problems with respiration noted  Abdomen: Soft, gravid, appropriate for gestational age.  Pain/Pressure: Present     Pelvic: Cervical exam performed Dilation: Fingertip Effacement (%): 50 Station: -3  Extremities: Normal range of motion.  Edema: Trace  Mental Status: Normal mood and affect. Normal behavior. Normal judgment and thought content.   Assessment and Plan:  Pregnancy: G3P2002 at [redacted]w[redacted]d  1. Supervision of other normal pregnancy, antepartum  - Culture, beta strep (group b only) - GC/Chlamydia probe amp ([place])not at [facility]  2. Supervision of normal intrauterine pregnancy in multigravida in first trimester  Labor precautions reviwed  Term labor symptoms and general obstetric precautions including but not limited to vaginal bleeding, contractions, leaking of fluid and fetal movement were reviewed in detail with the patient. Please refer to After Visit Summary for other counseling recommendations.  Return in about 1 week (around 02/21/2018) for [employer].  Future Appointments  Date Time Provider Department Center  02/24/2018 10:45 AM Anyanwu, Jethro Bastos, MD [facility] None    Wynelle Bourgeois, CNM

## 2018-02-17 ENCOUNTER — Encounter: Payer: Self-pay | Admitting: Advanced Practice Midwife

## 2018-02-17 DIAGNOSIS — B951 Streptococcus, group B, as the cause of diseases classified elsewhere: Secondary | ICD-10-CM | POA: Insufficient documentation

## 2018-02-17 DIAGNOSIS — O98819 Other maternal infectious and parasitic diseases complicating pregnancy, unspecified trimester: Secondary | ICD-10-CM

## 2018-02-17 LAB — CULTURE, BETA STREP (GROUP B ONLY): Strep Gp B Culture: POSITIVE — AB

## 2018-02-24 ENCOUNTER — Ambulatory Visit (INDEPENDENT_AMBULATORY_CARE_PROVIDER_SITE_OTHER): Payer: Medicaid Other | Admitting: Obstetrics & Gynecology

## 2018-02-24 VITALS — BP 122/63 | HR 85 | Wt 194.0 lb

## 2018-02-24 DIAGNOSIS — Z23 Encounter for immunization: Secondary | ICD-10-CM | POA: Diagnosis not present

## 2018-02-24 DIAGNOSIS — B951 Streptococcus, group B, as the cause of diseases classified elsewhere: Secondary | ICD-10-CM

## 2018-02-24 DIAGNOSIS — O98819 Other maternal infectious and parasitic diseases complicating pregnancy, unspecified trimester: Secondary | ICD-10-CM

## 2018-02-24 DIAGNOSIS — Z3480 Encounter for supervision of other normal pregnancy, unspecified trimester: Secondary | ICD-10-CM

## 2018-02-24 NOTE — Patient Instructions (Signed)
Return to clinic for any scheduled appointments or obstetric concerns, or go to MAU for evaluation  

## 2018-02-24 NOTE — Progress Notes (Signed)
   PRENATAL VISIT NOTE  Subjective:  Melissa Galloway is a 27 y.o. G3P2002 at [redacted]w[redacted]d being seen today for ongoing prenatal care.  She is currently monitored for the following issues for this low-risk pregnancy and has Normal pregnancy in multigravida in third trimester; Asthma; and Group B streptococcal infection during pregnancy on their problem list.  Patient reports occasional contractions.  Contractions: Not present. Vag. Bleeding: None.  Movement: Present. Denies leaking of fluid.   The following portions of the patient's history were reviewed and updated as appropriate: allergies, current medications, past family history, past medical history, past social history, past surgical history and problem list. Problem list updated.  Objective:   Vitals:   02/24/18 1053  BP: 122/63  Pulse: 85  Weight: 194 lb (88 kg)    Fetal Status: Fetal Heart Rate (bpm): 136 Fundal Height: 38 cm Movement: Present  Presentation: Vertex  General:  Alert, oriented and cooperative. Patient is in no acute distress.  Skin: Skin is warm and dry. No rash noted.   Cardiovascular: Normal heart rate noted  Respiratory: Normal respiratory effort, no problems with respiration noted  Abdomen: Soft, gravid, appropriate for gestational age.  Pain/Pressure: Present     Pelvic: Cervical exam performed Dilation: 3.5 Effacement (%): 50 Station: -3  Extremities: Normal range of motion.  Edema: Trace  Mental Status: Normal mood and affect. Normal behavior. Normal judgment and thought content.   Assessment and Plan:  Pregnancy: G3P2002 at [redacted]w[redacted]d  1. Group B streptococcal infection during pregnancy Will be treated in labor  2. Supervision of other normal pregnancy, antepartum - Flu Vaccine QUAD 36+ mos IM Good cervical change. Labor symptoms and general obstetric precautions including but not limited to vaginal bleeding, contractions, leaking of fluid and fetal movement were reviewed in detail with the patient. Please  refer to After Visit Summary for other counseling recommendations.  Return in about 1 week (around 03/03/2018) for OB Visit.  Future Appointments  Date Time Provider Department Center  03/03/2018 11:00 AM Levie Heritage, DO [facility] None    Jaynie Collins, MD

## 2018-02-26 ENCOUNTER — Inpatient Hospital Stay (HOSPITAL_COMMUNITY)
Admission: AD | Admit: 2018-02-26 | Discharge: 2018-03-01 | DRG: 807 | Disposition: A | Discharge status: Home / Self Care | Payer: Medicaid Other | Attending: Family Medicine | Admitting: Family Medicine

## 2018-02-26 ENCOUNTER — Encounter (HOSPITAL_COMMUNITY): Payer: Self-pay

## 2018-02-26 ENCOUNTER — Inpatient Hospital Stay (HOSPITAL_COMMUNITY): Payer: Medicaid Other | Admitting: Anesthesiology

## 2018-02-26 DIAGNOSIS — D649 Anemia, unspecified: Secondary | ICD-10-CM | POA: Diagnosis present

## 2018-02-26 DIAGNOSIS — Z3A38 38 weeks gestation of pregnancy: Secondary | ICD-10-CM

## 2018-02-26 DIAGNOSIS — J45909 Unspecified asthma, uncomplicated: Secondary | ICD-10-CM | POA: Diagnosis present

## 2018-02-26 DIAGNOSIS — K5900 Constipation, unspecified: Secondary | ICD-10-CM | POA: Diagnosis not present

## 2018-02-26 DIAGNOSIS — O9902 Anemia complicating childbirth: Secondary | ICD-10-CM | POA: Diagnosis present

## 2018-02-26 DIAGNOSIS — O99334 Smoking (tobacco) complicating childbirth: Secondary | ICD-10-CM | POA: Diagnosis present

## 2018-02-26 DIAGNOSIS — Z3483 Encounter for supervision of other normal pregnancy, third trimester: Secondary | ICD-10-CM | POA: Diagnosis present

## 2018-02-26 DIAGNOSIS — F17210 Nicotine dependence, cigarettes, uncomplicated: Secondary | ICD-10-CM | POA: Diagnosis present

## 2018-02-26 DIAGNOSIS — O99824 Streptococcus B carrier state complicating childbirth: Secondary | ICD-10-CM | POA: Diagnosis present

## 2018-02-26 DIAGNOSIS — O9952 Diseases of the respiratory system complicating childbirth: Secondary | ICD-10-CM | POA: Diagnosis present

## 2018-02-26 LAB — CBC
HCT: 30.9 % — ABNORMAL LOW (ref 36.0–46.0)
Hemoglobin: 10.2 g/dL — ABNORMAL LOW (ref 12.0–15.0)
MCH: 27.8 pg (ref 26.0–34.0)
MCHC: 33 g/dL (ref 30.0–36.0)
MCV: 84.2 fL (ref 80.0–100.0)
Platelets: 291 10*3/uL (ref 150–400)
RBC: 3.67 MIL/uL — ABNORMAL LOW (ref 3.87–5.11)
RDW: 13.8 % (ref 11.5–15.5)
WBC: 11.6 10*3/uL — ABNORMAL HIGH (ref 4.0–10.5)
nRBC: 0 % (ref 0.0–0.2)

## 2018-02-26 MED ORDER — PHENYLEPHRINE 40 MCG/ML (10ML) SYRINGE FOR IV PUSH (FOR BLOOD PRESSURE SUPPORT)
PREFILLED_SYRINGE | INTRAVENOUS | Status: AC
  Filled 2018-02-26: qty 10

## 2018-02-26 MED ORDER — SODIUM CHLORIDE 0.9 % IV SOLN
2.0000 g | Freq: Once | INTRAVENOUS | Status: DC
  Filled 2018-02-26: qty 2000

## 2018-02-26 MED ORDER — OXYTOCIN 40 UNITS IN LACTATED RINGERS INFUSION - SIMPLE MED
2.5000 [IU]/h | INTRAVENOUS | Status: DC
  Administered 2018-02-27: 2.5 [IU]/h via INTRAVENOUS
  Filled 2018-02-26: qty 1000

## 2018-02-26 MED ORDER — SODIUM CHLORIDE 0.9 % IV SOLN
2.0000 g | Freq: Once | INTRAVENOUS | Status: AC
  Administered 2018-02-26: 2 g via INTRAVENOUS
  Filled 2018-02-26: qty 2

## 2018-02-26 MED ORDER — LACTATED RINGERS IV SOLN
INTRAVENOUS | Status: DC
  Administered 2018-02-26 – 2018-02-27 (×2): via INTRAVENOUS

## 2018-02-26 MED ORDER — FENTANYL 2.5 MCG/ML BUPIVACAINE 1/10 % EPIDURAL INFUSION (WH - ANES)
INTRAMUSCULAR | Status: AC
  Filled 2018-02-26: qty 100

## 2018-02-26 MED ORDER — ACETAMINOPHEN 325 MG PO TABS: 650.0000 mg | ORAL_TABLET | ORAL | Status: DC | PRN

## 2018-02-26 MED ORDER — ONDANSETRON HCL 4 MG/2ML IJ SOLN: 4.0000 mg | Freq: Four times a day (QID) | INTRAMUSCULAR | Status: DC | PRN

## 2018-02-26 MED ORDER — SOD CITRATE-CITRIC ACID 500-334 MG/5ML PO SOLN
30.0000 mL | ORAL | Status: DC | PRN
  Administered 2018-02-27: 30 mL via ORAL
  Filled 2018-02-26: qty 15

## 2018-02-26 MED ORDER — LACTATED RINGERS IV SOLN
500.0000 mL | INTRAVENOUS | Status: DC | PRN
  Administered 2018-02-26: 500 mL via INTRAVENOUS

## 2018-02-26 MED ORDER — LIDOCAINE HCL (PF) 1 % IJ SOLN
30.0000 mL | INTRAMUSCULAR | Status: DC | PRN
  Filled 2018-02-26: qty 30

## 2018-02-26 MED ORDER — OXYTOCIN BOLUS FROM INFUSION
500.0000 mL | Freq: Once | INTRAVENOUS | Status: AC
  Administered 2018-02-27: 500 mL via INTRAVENOUS

## 2018-02-27 ENCOUNTER — Encounter (HOSPITAL_COMMUNITY): Payer: Self-pay

## 2018-02-27 ENCOUNTER — Other Ambulatory Visit: Payer: Self-pay

## 2018-02-27 DIAGNOSIS — O99824 Streptococcus B carrier state complicating childbirth: Secondary | ICD-10-CM

## 2018-02-27 DIAGNOSIS — Z3A38 38 weeks gestation of pregnancy: Secondary | ICD-10-CM

## 2018-02-27 LAB — TYPE AND SCREEN
ABO/RH(D): O POS
Antibody Screen: NEGATIVE

## 2018-02-27 LAB — RPR: RPR Ser Ql: NONREACTIVE

## 2018-02-27 MED ORDER — ALBUTEROL SULFATE (2.5 MG/3ML) 0.083% IN NEBU: 3.0000 mL | INHALATION_SOLUTION | Freq: Four times a day (QID) | RESPIRATORY_TRACT | Status: DC | PRN

## 2018-02-27 MED ORDER — LACTATED RINGERS IV SOLN: 500.0000 mL | Freq: Once | INTRAVENOUS | Status: DC

## 2018-02-27 MED ORDER — TETANUS-DIPHTH-ACELL PERTUSSIS 5-2.5-18.5 LF-MCG/0.5 IM SUSP: 0.5000 mL | Freq: Once | INTRAMUSCULAR | Status: DC

## 2018-02-27 MED ORDER — ALBUTEROL SULFATE (2.5 MG/3ML) 0.083% IN NEBU
3.0000 mL | INHALATION_SOLUTION | Freq: Four times a day (QID) | RESPIRATORY_TRACT | Status: DC
  Administered 2018-02-28 – 2018-03-01 (×3): 3 mL via RESPIRATORY_TRACT
  Filled 2018-02-27 (×5): qty 3

## 2018-02-27 MED ORDER — DIBUCAINE 1 % RE OINT: 1.0000 "application " | TOPICAL_OINTMENT | RECTAL | Status: DC | PRN

## 2018-02-27 MED ORDER — EPHEDRINE 5 MG/ML INJ
10.0000 mg | INTRAVENOUS | Status: DC | PRN
  Filled 2018-02-27: qty 2

## 2018-02-27 MED ORDER — PRENATAL MULTIVITAMIN CH
1.0000 | ORAL_TABLET | Freq: Every day | ORAL | Status: DC
  Administered 2018-02-27 – 2018-02-28 (×2): 1 via ORAL
  Filled 2018-02-27 (×3): qty 1

## 2018-02-27 MED ORDER — PHENYLEPHRINE 40 MCG/ML (10ML) SYRINGE FOR IV PUSH (FOR BLOOD PRESSURE SUPPORT)
80.0000 ug | PREFILLED_SYRINGE | INTRAVENOUS | Status: DC | PRN
  Filled 2018-02-27: qty 5

## 2018-02-27 MED ORDER — ZOLPIDEM TARTRATE 5 MG PO TABS: 5.0000 mg | ORAL_TABLET | Freq: Every evening | ORAL | Status: DC | PRN

## 2018-02-27 MED ORDER — FENTANYL 2.5 MCG/ML BUPIVACAINE 1/10 % EPIDURAL INFUSION (WH - ANES)
INTRAMUSCULAR | Status: DC | PRN
  Administered 2018-02-26: 14 mL/h via EPIDURAL

## 2018-02-27 MED ORDER — FENTANYL 2.5 MCG/ML BUPIVACAINE 1/10 % EPIDURAL INFUSION (WH - ANES): 14.0000 mL/h | INTRAMUSCULAR | Status: DC | PRN

## 2018-02-27 MED ORDER — COCONUT OIL OIL: 1.0000 "application " | TOPICAL_OIL | Status: DC | PRN

## 2018-02-27 MED ORDER — BENZOCAINE-MENTHOL 20-0.5 % EX AERO: 1.0000 "application " | INHALATION_SPRAY | CUTANEOUS | Status: DC | PRN

## 2018-02-27 MED ORDER — DIPHENHYDRAMINE HCL 25 MG PO CAPS: 25.0000 mg | ORAL_CAPSULE | Freq: Four times a day (QID) | ORAL | Status: DC | PRN

## 2018-02-27 MED ORDER — ACETAMINOPHEN 325 MG PO TABS
650.0000 mg | ORAL_TABLET | ORAL | Status: DC | PRN
  Administered 2018-02-27 (×2): 650 mg via ORAL
  Filled 2018-02-27 (×2): qty 2

## 2018-02-27 MED ORDER — ONDANSETRON HCL 4 MG/2ML IJ SOLN: 4.0000 mg | INTRAMUSCULAR | Status: DC | PRN

## 2018-02-27 MED ORDER — LIDOCAINE HCL (PF) 1 % IJ SOLN
INTRAMUSCULAR | Status: DC | PRN
  Administered 2018-02-26: 8 mL via EPIDURAL

## 2018-02-27 MED ORDER — SIMETHICONE 80 MG PO CHEW: 80.0000 mg | CHEWABLE_TABLET | ORAL | Status: DC | PRN

## 2018-02-27 MED ORDER — SENNOSIDES-DOCUSATE SODIUM 8.6-50 MG PO TABS
2.0000 | ORAL_TABLET | ORAL | Status: DC
  Administered 2018-02-27 – 2018-03-01 (×2): 2 via ORAL
  Filled 2018-02-27 (×2): qty 2

## 2018-02-27 MED ORDER — IBUPROFEN 600 MG PO TABS
600.0000 mg | ORAL_TABLET | Freq: Four times a day (QID) | ORAL | Status: DC
  Administered 2018-02-27 – 2018-03-01 (×10): 600 mg via ORAL
  Filled 2018-02-27 (×10): qty 1

## 2018-02-27 MED ORDER — WITCH HAZEL-GLYCERIN EX PADS: 1.0000 "application " | MEDICATED_PAD | CUTANEOUS | Status: DC | PRN

## 2018-02-27 MED ORDER — DIPHENHYDRAMINE HCL 50 MG/ML IJ SOLN: 12.5000 mg | INTRAMUSCULAR | Status: DC | PRN

## 2018-02-27 MED ORDER — ONDANSETRON HCL 4 MG PO TABS: 4.0000 mg | ORAL_TABLET | ORAL | Status: DC | PRN

## 2018-02-27 NOTE — Lactation Note (Addendum)
This note was copied from a baby's chart. Lactation Consultation Note  Patient Name: Melissa Galloway [id number] Date: 02/27/2018   Mother states she does not want to breastfeed. She plans to formula feed.      Maternal Data    Feeding Feeding Type: Bottle Fed - Formula Nipple Type: Slow - flow  LATCH Score                   Interventions    Lactation Tools Discussed/Used     Consult Status      Hardie Pulley 02/27/2018, 3:50 PM

## 2018-02-27 NOTE — Anesthesia Procedure Notes (Signed)
Epidural Patient location during procedure: OB Start time: 02/27/2018 11:50 PM End time: 02/27/2018 12:01 AM  Staffing Anesthesiologist: Bethena Midget, MD  Preanesthetic Checklist Completed: patient identified, site marked, surgical consent, pre-op evaluation, timeout performed, IV checked, risks and benefits discussed and monitors and equipment checked  Epidural Patient position: sitting Prep: site prepped and draped and DuraPrep Patient monitoring: continuous pulse ox and blood pressure Approach: midline Location: L4-L5 Injection technique: LOR air  Needle:  Needle type: Tuohy  Needle gauge: 17 G Needle length: 9 cm and 9 Needle insertion depth: 8 cm Catheter type: closed end flexible Catheter size: 19 Gauge Catheter at skin depth: 13 cm Test dose: negative  Assessment Events: blood not aspirated, injection not painful, no injection resistance, negative IV test and no paresthesia

## 2018-02-27 NOTE — Anesthesia Postprocedure Evaluation (Signed)
Anesthesia Post Note  Patient: Melissa Galloway  Procedure(s) Performed: AN AD HOC LABOR EPIDURAL     Patient location during evaluation: Mother Baby Anesthesia Type: Epidural Level of consciousness: awake and alert Pain management: pain level controlled Vital Signs Assessment: post-procedure vital signs reviewed and stable Respiratory status: spontaneous breathing Cardiovascular status: blood pressure returned to baseline Postop Assessment: no headache, patient able to bend at knees, no backache, no apparent nausea or vomiting, epidural receding, adequate PO intake and able to ambulate Anesthetic complications: no    Last Vitals:  Vitals:   02/27/18 0456 02/27/18 0600  BP: 132/72 (!) 104/59  Pulse: 93 85  Resp: 18 18  Temp: 36.7 C 36.8 C  SpO2: 99% 97%    Last Pain:  Vitals:   02/27/18 0600  TempSrc:   PainSc: 0-No pain   Pain Goal:                 Salome Arnt

## 2018-02-27 NOTE — H&P (Signed)
LABOR AND DELIVERY ADMISSION HISTORY AND PHYSICAL NOTE  Melissa Galloway is a 27 y.o. female G3P2002 with IUP at [redacted]w[redacted]d by early 8wk [place] presenting for SOL. She reports positive fetal movement. She denies leakage of fluid.  Prenatal History/Complications: PNC at [facility] Pregnancy complications:  - GBS during pregnancy  - Asthma   Past Medical History: Past Medical History:  Diagnosis Date  . Abnormal Pap smear   . Asthma     Past Surgical History: History reviewed. No pertinent surgical history.  Obstetrical History: OB History    Gravida  3   Para  2   Term  2   Preterm  0   AB  0   Living  2     SAB  0   TAB  0   Ectopic  0   Multiple      Live Births  2           Social History: Social History   Socioeconomic History  . Marital status: Single    Spouse name: Not on file  . Number of children: Not on file  . Years of education: Not on file  . Highest education level: Not on file  Occupational History  . Not on file  Social Needs  . Financial resource strain: Not on file  . Food insecurity:    Worry: Not on file    Inability: Not on file  . Transportation needs:    Medical: Not on file    Non-medical: Not on file  Tobacco Use  . Smoking status: Light Tobacco Smoker    Packs/day: 0.00    Years: 5.00    Pack years: 0.00    Types: Cigarettes  . Smokeless tobacco: Never Used  Substance and Sexual Activity  . Alcohol use: No    Comment: occasionally  . Drug use: No  . Sexual activity: Yes  Lifestyle  . Physical activity:    Days per week: Not on file    Minutes per session: Not on file  . Stress: Not on file  Relationships  . Social connections:    Talks on phone: Not on file    Gets together: Not on file    Attends religious service: Not on file    Active member of club or organization: Not on file    Attends meetings of clubs or organizations: Not on file    Relationship status: Not on file  Other Topics Concern  . Not on file   Social History Narrative   ** Merged History Encounter **        Family History: Family History  Problem Relation Age of Onset  . Heart disease Mother   . Hypertension Mother   . Diabetes Mother   . Hypertension Father   . Diabetes Father   . Drug abuse Father   . Diabetes Sister   . Diabetes Brother   . Early death Paternal Aunt     Allergies: Allergies  Allergen Reactions  . Codeine Other (See Comments)    Seizure like activity Shaking and seizure like Shaking and seizure like  . Codeine Other (See Comments)    Childhood allergy    Medications Prior to Admission  Medication Sig Dispense Refill Last Dose  . albuterol (PROVENTIL HFA;VENTOLIN HFA) 108 (90 Base) MCG/ACT inhaler Inhale 2 puffs into the lungs every 6 (six) hours as needed for wheezing or shortness of breath. 1 Inhaler 6 02/26/2018 at Unknown time  . pantoprazole (PROTONIX) 20  MG tablet Take 1 tablet (20 mg total) by mouth daily. 30 tablet 0 Past Month at Unknown time  . Prenatal Vit-Fe Fum-Fe Bisg-FA (NATACHEW) 28-1 MG CHEW Chew 1 tablet by mouth daily. 30 tablet 12 Past Week at Unknown time  . acetaminophen (TYLENOL) 80 MG chewable tablet Chew 80 mg by mouth every 6 (six) hours as needed.   More than a month at Unknown time     Review of Systems  All systems reviewed and negative except as stated in HPI  Physical Exam Blood pressure 113/67, pulse 97, temperature 98.1 F (36.7 C), temperature source Oral, resp. rate 16, height 5\' 3"  (1.6 m), weight 88.5 kg, last menstrual period 05/22/2017, SpO2 99 %, unknown if currently breastfeeding. General appearance: alert, cooperative and mild distress Lungs: clear to auscultation bilaterally Heart: regular rate and rhythm Abdomen: soft, non-tender; bowel sounds normal Extremities: No calf swelling or tenderness Presentation: cephalic Fetal monitoring: 130/ moderate/ +accels/ early decelerations  Uterine activity: 2-4/ moderate by palpation  Dilation:  8.5 Effacement (%): 100 Station: 0 Exam by:: Doretha Imus, RN   Prenatal labs: ABO, Rh: O/Positive/-- (03/12 1323) Antibody: Negative (03/12 1323) Rubella: 3.44 (03/12 1323) RPR: Non Reactive (07/18 1010)  HBsAg: Negative (03/12 1323)  HIV: Non Reactive (07/18 1010)  GC/Chlamydia: Negative  GBS: Positive (10/08 0000)  2 hr Glucola: [phone] Genetic screening:  Normal  Anatomy US: Normal female   Clinic  [place] Prenatal Labs  Dating  8wk [place] Blood type: O/Positive/-- (03/12 1323) O +  Genetic Screen 1 Screen:  normal   AFP: negative    Antibody:Negative (03/12 1323)Neg  Anatomic US WNL Rubella: 3.44 (03/12 1323)Immune  GTT Third trimester: normal RPR: Non Reactive (07/18 1010) NR  Flu vaccine 02/24/18 HBsAg: Negative (03/12 1323) Neg  TDaP vaccine 11/24/17                                        HIV: Non Reactive (07/18 1010) NR  Baby Food  Breast GBS: (For PCN allergy, check sensitivities) done 02/14/18  Contraception  BTL maybe, signed 10-20-17 Pap:  Normal   Circumcision   Yes   Pediatrician  practice in [place] CF:Neg  Support Person   [place]  SMA: Neg  Prenatal Classes  n/a Hgb electrophoresis: Normal AA    Prenatal Transfer Tool  Maternal Diabetes: No Genetic Screening: Normal Maternal Ultrasounds/Referrals: Normal Fetal Ultrasounds or other Referrals:  None Maternal Substance Abuse:  No Significant Maternal Medications:  None Significant Maternal Lab Results: Lab values include: Group B Strep positive  Results for orders placed or performed during the hospital encounter of 02/26/18 (from the past 24 hour(s))  CBC   Collection Time: 02/26/18 11:26 PM  Result Value Ref Range   WBC 11.6 (H) 4.0 - 10.5 K/uL   RBC 3.67 (L) 3.87 - 5.11 MIL/uL   Hemoglobin 10.2 (L) 12.0 - 15.0 g/dL   HCT 54.0 (L) 98.1 - 19.1 %   MCV 84.2 80.0 - 100.0 fL   MCH 27.8 26.0 - 34.0 pg   MCHC 33.0 30.0 - 36.0 g/dL   RDW 47.8 29.5 - 62.1 %   Platelets 291 150 - 400 K/uL   nRBC 0.0 0.0  - 0.2 %    Patient Active Problem List   Diagnosis Date Noted  . Normal labor 02/26/2018  . Group B streptococcal infection during pregnancy 02/17/2018  . Asthma  09/22/2017  . Normal pregnancy in multigravida in third trimester 07/19/2017    Assessment: Melissa Galloway is a 27 y.o. G3P2002 at [redacted]w[redacted]d here for SOL  #Labor: expectant management  #Pain: Plans epidural  #FWB: Cat I  #ID:  GBS pos- amp  #MOF: Formula  #MOC: IUD #Circ:  Yes, outpatient   Sharyon Cable, CNM 02/27/2018, 12:27 AM

## 2018-02-27 NOTE — Anesthesia Preprocedure Evaluation (Signed)
Anesthesia Evaluation  Patient identified by MRN, date of birth, ID band Patient awake    Reviewed: Allergy & Precautions, H&P , NPO status , Patient's Chart, lab work & pertinent test results, reviewed documented beta blocker date and time   Airway Mallampati: II  TM Distance: >3 FB Neck ROM: full    Dental no notable dental hx.    Pulmonary neg pulmonary ROS, Current Smoker,    Pulmonary exam normal breath sounds clear to auscultation       Cardiovascular negative cardio ROS Normal cardiovascular exam Rhythm:regular Rate:Normal     Neuro/Psych negative neurological ROS  negative psych ROS   GI/Hepatic negative GI ROS, Neg liver ROS,   Endo/Other  negative endocrine ROS  Renal/GU negative Renal ROS  negative genitourinary   Musculoskeletal   Abdominal   Peds  Hematology negative hematology ROS (+)   Anesthesia Other Findings   Reproductive/Obstetrics (+) Pregnancy                             Anesthesia Physical Anesthesia Plan  ASA: II  Anesthesia Plan: Epidural   Post-op Pain Management:    Induction:   PONV Risk Score and Plan:   Airway Management Planned:   Additional Equipment:   Intra-op Plan:   Post-operative Plan:   Informed Consent: I have reviewed the patients History and Physical, chart, labs and discussed the procedure including the risks, benefits and alternatives for the proposed anesthesia with the patient or authorized representative who has indicated his/her understanding and acceptance.     Plan Discussed with:   Anesthesia Plan Comments:         Anesthesia Quick Evaluation  

## 2018-02-28 LAB — BIRTH TISSUE RECOVERY COLLECTION (PLACENTA DONATION)

## 2018-02-28 MED ORDER — CALCIUM CARBONATE ANTACID 500 MG PO CHEW
2.0000 | CHEWABLE_TABLET | Freq: Once | ORAL | Status: AC
  Administered 2018-03-01: 400 mg via ORAL
  Filled 2018-02-28: qty 2

## 2018-02-28 NOTE — Progress Notes (Signed)
POSTPARTUM PROGRESS NOTE  POD #1  Subjective:  Melissa Galloway is a 27 y.o. [phone] s/p SVD at [redacted]w[redacted]d.  She reports she doing well. No acute events overnight. She reports she is doing well. She denies any problems with ambulating, voiding or po intake. Denies nausea or vomiting. She has not passed flatus. Pain is well controlled on Tylenol. She has not had a BM.  Objective: Blood pressure 114/76, pulse 74, temperature 98.1 F (36.7 C), temperature source Oral, resp. rate 18, height 5\' 3"  (1.6 m), weight 88.5 kg, last menstrual period 05/22/2017, SpO2 99 %, unknown if currently breastfeeding.  Physical Exam:  General: alert, cooperative and no distress Chest: no respiratory distress Heart:regular rate, distal pulses intact Abdomen: soft, nontender,  Uterine Fundus: firm, appropriately tender DVT Evaluation: No calf swelling or tenderness Extremities: No edema Skin: warm, dry  Recent Labs    02/26/18 2326  HGB 10.2*  HCT 30.9*    Assessment/Plan: Melissa Galloway is a 27 y.o. [id number] s/p SVD at [redacted]w[redacted]d for SOL.  POD#1 - Doing welll; pain well controlled. H/H appropriate  Routine postpartum care  OOB, ambulated  Lovenox for VTE prophylaxis Anemia: asymptomatic  Start po ferrous sulfate BID Constipation: start on stool softener Contraception: IUD Feeding: Both breast and bottle feeding  Dispo: Plan for discharge tomorrow.   LOS: 2 days    Eli Hose, PA-S  Marcy Siren, D.O. OB Fellow  02/28/2018, 9:04 AM

## 2018-03-01 NOTE — Discharge Summary (Signed)
Postpartum Discharge Summary     Patient Name: Melissa Galloway DOB: March 31, 1991 MRN: [id number]  Date of admission: 02/26/2018 Delivering Provider: Sharyon Cable   Date of discharge: 03/01/2018  Admitting diagnosis: 40WKS CTX Intrauterine pregnancy: [redacted]w[redacted]d     Secondary diagnosis:  Active Problems:   Normal labor   SVD (spontaneous vaginal delivery)  Additional problems: none     Discharge diagnosis: Term Pregnancy Delivered                                                                                                Post partum procedures:none  Augmentation: none  Complications: None  Hospital course:  Onset of Labor With Vaginal Delivery     27 y.o. yo G3P3003 at [redacted]w[redacted]d was admitted in Active Labor on 02/26/2018. Patient had an uncomplicated labor course as follows:  Membrane Rupture Time/Date: 2:19 AM ,02/27/2018   Intrapartum Procedures: Episiotomy: None [1]                                         Lacerations:  None [1]  Patient had a delivery of a Viable infant. 02/27/2018  Information for the patient's newborn:  Kinga, Cassar [[id number]]  Delivery Method: Vag-Spont    Pateint had an uncomplicated postpartum course.  She is ambulating, tolerating a regular diet, passing flatus, and urinating well. Patient is discharged home in stable condition on 03/01/18.   Magnesium Sulfate recieved: No BMZ received: No  Physical exam  Vitals:   02/28/18 0525 02/28/18 0904 02/28/18 1317 03/01/18 0527  BP: 114/76 115/77 116/75 115/71  Pulse: 74 71 68 74  Resp: 18 18 18 16   Temp: 98.1 F (36.7 C) 98.2 F (36.8 C) 98.4 F (36.9 C) 97.9 F (36.6 C)  TempSrc: Oral Oral Oral   SpO2: 99% 99%  99%  Weight:      Height:       General: alert, cooperative and no distress Lochia: appropriate Uterine Fundus: firm Incision: N/A DVT Evaluation: No evidence of DVT seen on physical exam. Labs: Lab Results  Component Value Date   WBC 11.6 (H) 02/26/2018   HGB 10.2  (L) 02/26/2018   HCT 30.9 (L) 02/26/2018   MCV 84.2 02/26/2018   PLT 291 02/26/2018   CMP Latest Ref Rng & Units 01/05/2014  Glucose 70 - 99 mg/dL 93  BUN 6 - 23 mg/dL 12  Creatinine 1.32 - 4.40 mg/dL 1.02(V)  Sodium 253 - 664 mEq/L 140  Potassium 3.7 - 5.3 mEq/L 3.7  Chloride 96 - 112 mEq/L 103  CO2 19 - 32 mEq/L 24  Calcium 8.4 - 10.5 mg/dL 9.1  Total Protein 6.0 - 8.3 g/dL 7.4  Total Bilirubin 0.3 - 1.2 mg/dL 0.5  Alkaline Phos 39 - 117 U/L 76  AST 0 - 37 U/L 18  ALT 0 - 35 U/L 11    Discharge instruction: per After Visit Summary and "Baby and Me Booklet".  After visit meds:  Allergies as of 03/01/2018  Reactions   Codeine Other (See Comments)   Seizure like activity Shaking and seizure like Shaking and seizure like      Medication List    STOP taking these medications   acetaminophen 80 MG chewable tablet Commonly known as:  TYLENOL     TAKE these medications   albuterol 108 (90 Base) MCG/ACT inhaler Commonly known as:  PROVENTIL HFA;VENTOLIN HFA Inhale 2 puffs into the lungs every 6 (six) hours as needed for wheezing or shortness of breath.   NATACHEW 28-1 MG Chew Chew 1 tablet by mouth daily.   pantoprazole 20 MG tablet Commonly known as:  PROTONIX Take 1 tablet (20 mg total) by mouth daily.       Diet: routine diet  Activity: Advance as tolerated. Pelvic rest for 6 weeks.   Outpatient follow up:4 weeks Follow up Appt: Future Appointments  Date Time Provider Department Center  04/03/2018 10:30 AM Levie Heritage, DO [facility] None    Newborn Data: Live born female  Birth Weight: 7 lb 8.8 oz (3425 g) APGAR: 8, 9  Newborn Delivery   Birth date/time:  02/27/2018 02:37:00 Delivery type:  Vaginal, Spontaneous     Baby Feeding: Bottle Disposition:home with mother   03/01/2018 Gwenevere Abbot, MD

## 2018-03-03 ENCOUNTER — Encounter: Payer: Self-pay | Admitting: Family Medicine

## 2018-03-04 ENCOUNTER — Other Ambulatory Visit: Payer: Self-pay | Admitting: Advanced Practice Midwife

## 2018-03-29 ENCOUNTER — Other Ambulatory Visit: Payer: Self-pay | Admitting: Family Medicine

## 2018-04-03 ENCOUNTER — Ambulatory Visit: Payer: Self-pay | Admitting: Obstetrics & Gynecology

## 2018-04-04 ENCOUNTER — Other Ambulatory Visit: Payer: Self-pay

## 2018-04-04 ENCOUNTER — Ambulatory Visit: Payer: Self-pay | Admitting: Advanced Practice Midwife

## 2018-04-11 ENCOUNTER — Encounter: Payer: Self-pay | Admitting: Obstetrics & Gynecology

## 2018-04-11 ENCOUNTER — Ambulatory Visit (INDEPENDENT_AMBULATORY_CARE_PROVIDER_SITE_OTHER): Payer: Medicaid Other | Admitting: Obstetrics & Gynecology

## 2018-04-11 DIAGNOSIS — Z1389 Encounter for screening for other disorder: Secondary | ICD-10-CM | POA: Diagnosis not present

## 2018-04-11 DIAGNOSIS — Z30430 Encounter for insertion of intrauterine contraceptive device: Secondary | ICD-10-CM

## 2018-04-11 DIAGNOSIS — Z3009 Encounter for other general counseling and advice on contraception: Secondary | ICD-10-CM

## 2018-04-11 DIAGNOSIS — Z3202 Encounter for pregnancy test, result negative: Secondary | ICD-10-CM

## 2018-04-11 LAB — POCT URINE PREGNANCY: PREG TEST UR: NEGATIVE

## 2018-04-11 MED ORDER — LEVONORGESTREL 19.5 MCG/DAY IU IUD
INTRAUTERINE_SYSTEM | Freq: Once | INTRAUTERINE | Status: AC
  Administered 2018-04-11: 11:00:00 via INTRAUTERINE

## 2018-04-11 NOTE — Progress Notes (Signed)
Subjective:     Melissa Galloway is a 27 y.o. female who presents for a postpartum visit. She is 6 weeks postpartum following a spontaneous vaginal delivery. I have fully reviewed the prenatal and intrapartum course. The delivery was at 38 gestational weeks. Outcome: spontaneous vaginal delivery. Anesthesia: epidural. Postpartum course has been uncomplicated. Baby's course has been unremarkable. Baby is feeding by [employer] . Bleeding no bleeding. Bowel function is normal. Bladder function is normal. Patient is not sexually active. Contraception method is IUD. Postpartum depression screening: negative.  The following portions of the patient's history were reviewed and updated as appropriate: allergies, current medications, past family history, past medical history, past social history, past surgical history and problem list.  Review of Systems Pertinent items are noted in HPI.   Objective:    LMP 05/22/2017 (Exact Date)   BP 110/69   Pulse 88   Ht 5\' 3"  (1.6 m)   Wt 172 lb (78 kg)   LMP 05/22/2017 (Exact Date)   BMI 30.47 kg/m   GYNECOLOGY CLINIC PROCEDURE NOTE  IUD Insertion Procedure Note Patient identified, informed consent performed.  Discussed risks of irregular bleeding, cramping, infection, malpositioning or misplacement of the IUD outside the uterus which may require further procedures. Time out was performed.  Urine pregnancy test negative.  Speculum placed in the vagina.  Cervix visualized.  Cleaned with Betadine x 2.  Grasped anteriorly with a single tooth tenaculum.  Uterus sounded to 10 cm.  Liletta IUD placed per manufacturer's recommendations.  Strings trimmed to 3 cm. Tenaculum was removed, good hemostasis noted.  Patient tolerated procedure well.    Assessment:     6 weeks postpartum exam. Pap smear not done at today's visit.   Desires PP LnIUD  Plan:    1. Contraception: IUD 2.Patient was given post-procedure instructions.   3. Patient was asked to  follow up in 4 weeks for IUD check.   Melissa Galloway, M.D., [facility]

## 2018-04-11 NOTE — Patient Instructions (Signed)
Intrauterine Device Insertion, Care After This sheet gives you information about how to care for yourself after your procedure. Your health care provider may also give you more specific instructions. If you have problems or questions, contact your health care provider. What can I expect after the procedure? After the procedure, it is common to have:  Cramps and pain in the abdomen.  Light bleeding (spotting) or heavier bleeding that is like your menstrual period. This may last for up to a few days.  Lower back pain.  Dizziness.  Headaches.  Nausea.  Follow these instructions at home:  Before resuming sexual activity, check to make sure that you can feel the IUD string(s). You should be able to feel the end of the string(s) below the opening of your cervix. If your IUD string is in place, you may resume sexual activity. ? If you had a hormonal IUD inserted more than 7 days after your most recent period started, you will need to use a backup method of birth control for 7 days after IUD insertion. Ask your health care provider whether this applies to you.  Continue to check that the IUD is still in place by feeling for the string(s) after every menstrual period, or once a month.  Take over-the-counter and prescription medicines only as told by your health care provider.  Do not drive or use heavy machinery while taking prescription pain medicine.  Keep all follow-up visits as told by your health care provider. This is important. Contact a health care provider if:  You have bleeding that is heavier or lasts longer than a normal menstrual cycle.  You have a fever.  You have cramps or abdominal pain that get worse or do not get better with medicine.  You develop abdominal pain that is new or is not in the same area of earlier cramping and pain.  You feel lightheaded or weak.  You have abnormal or bad-smelling discharge from your vagina.  You have pain during sexual  activity.  You have any of the following problems with your IUD string(s): ? The string bothers or hurts you or your sexual partner. ? You cannot feel the string. ? The string has gotten longer.  You can feel the IUD in your vagina.  You think you may be pregnant, or you miss your menstrual period.  You think you may have an STI (sexually transmitted infection). Get help right away if:  You have flu-like symptoms.  You have a fever and chills.  You can feel that your IUD has slipped out of place. Summary  After the procedure, it is common to have cramps and pain in the abdomen. It is also common to have light bleeding (spotting) or heavier bleeding that is like your menstrual period.  Continue to check that the IUD is still in place by feeling for the string(s) after every menstrual period, or once a month.  Keep all follow-up visits as told by your health care provider. This is important.  Contact your health care provider if you have problems with your IUD string(s), such as the string getting longer or bothering you or your sexual partner. This information is not intended to replace advice given to you by your health care provider. Make sure you discuss any questions you have with your health care provider. Document Released: 12/23/2010 Document Revised: 03/17/2016 Document Reviewed: 03/17/2016 Elsevier Interactive Patient Education  2017 [employer] Levonorgestrel intrauterine device (IUD) What is this medicine? LEVONORGESTREL IUD (LEE voe nor   jes trel) is a contraceptive (birth control) device. The device is placed inside the uterus by a healthcare professional. It is used to prevent pregnancy. This device can also be used to treat heavy bleeding that occurs during your period. This medicine may be used for other purposes; ask your health care provider or pharmacist if you have questions. COMMON BRAND NAME(S): Kyleena, LILETTA, Mirena, Skyla What should I tell my health  care provider before I take this medicine? They need to know if you have any of these conditions: -abnormal Pap smear -cancer of the breast, uterus, or cervix -diabetes -endometritis -genital or pelvic infection now or in the past -have more than one sexual partner or your partner has more than one partner -heart disease -history of an ectopic or tubal pregnancy -immune system problems -IUD in place -liver disease or tumor -problems with blood clots or take blood-thinners -seizures -use intravenous drugs -uterus of unusual shape -vaginal bleeding that has not been explained -an unusual or allergic reaction to levonorgestrel, other hormones, silicone, or polyethylene, medicines, foods, dyes, or preservatives -pregnant or trying to get pregnant -breast-feeding How should I use this medicine? This device is placed inside the uterus by a health care professional. Talk to your pediatrician regarding the use of this medicine in children. Special care may be needed. Overdosage: If you think you have taken too much of this medicine contact a poison control center or emergency room at once. NOTE: This medicine is only for you. Do not share this medicine with others. What if I miss a dose? This does not apply. Depending on the brand of device you have inserted, the device will need to be replaced every 3 to 5 years if you wish to continue using this type of birth control. What may interact with this medicine? Do not take this medicine with any of the following medications: -amprenavir -bosentan -fosamprenavir This medicine may also interact with the following medications: -aprepitant -armodafinil -barbiturate medicines for inducing sleep or treating seizures -bexarotene -boceprevir -griseofulvin -medicines to treat seizures like carbamazepine, ethotoin, felbamate, oxcarbazepine, phenytoin, topiramate -modafinil -pioglitazone -rifabutin -rifampin -rifapentine -some medicines to  treat HIV infection like atazanavir, efavirenz, indinavir, lopinavir, nelfinavir, tipranavir, ritonavir -St. John's wort -warfarin This list may not describe all possible interactions. Give your health care provider a list of all the medicines, herbs, non-prescription drugs, or dietary supplements you use. Also tell them if you smoke, drink alcohol, or use illegal drugs. Some items may interact with your medicine. What should I watch for while using this medicine? Visit your doctor or health care professional for regular check ups. See your doctor if you or your partner has sexual contact with others, becomes HIV positive, or gets a sexual transmitted disease. This product does not protect you against HIV infection (AIDS) or other sexually transmitted diseases. You can check the placement of the IUD yourself by reaching up to the top of your vagina with clean fingers to feel the threads. Do not pull on the threads. It is a good habit to check placement after each menstrual period. Call your doctor right away if you feel more of the IUD than just the threads or if you cannot feel the threads at all. The IUD may come out by itself. You may become pregnant if the device comes out. If you notice that the IUD has come out use a backup birth control method like condoms and call your health care provider. Using tampons will not change the position of the   IUD and are okay to use during your period. This IUD can be safely scanned with magnetic resonance imaging (MRI) only under specific conditions. Before you have an MRI, tell your healthcare provider that you have an IUD in place, and which type of IUD you have in place. What side effects may I notice from receiving this medicine? Side effects that you should report to your doctor or health care professional as soon as possible: -allergic reactions like skin rash, itching or hives, swelling of the face, lips, or tongue -fever, flu-like symptoms -genital  sores -high blood pressure -no menstrual period for 6 weeks during use -pain, swelling, warmth in the leg -pelvic pain or tenderness -severe or sudden headache -signs of pregnancy -stomach cramping -sudden shortness of breath -trouble with balance, talking, or walking -unusual vaginal bleeding, discharge -yellowing of the eyes or skin Side effects that usually do not require medical attention (report to your doctor or health care professional if they continue or are bothersome): -acne -breast pain -change in sex drive or performance -changes in weight -cramping, dizziness, or faintness while the device is being inserted -headache -irregular menstrual bleeding within first 3 to 6 months of use -nausea This list may not describe all possible side effects. Call your doctor for medical advice about side effects. You may report side effects to FDA at 1-800-FDA-1088. Where should I keep my medicine? This does not apply. NOTE: This sheet is a summary. It may not cover all possible information. If you have questions about this medicine, talk to your doctor, pharmacist, or health care provider.  2018 Elsevier/Gold Standard (2016-02-06 14:14:56)  

## 2018-04-11 NOTE — Addendum Note (Signed)
Addended by: Lorelle GibbsWILSON, CHIQUITA L on: 04/11/2018 11:32 AM   Modules accepted: Orders

## 2018-05-08 ENCOUNTER — Ambulatory Visit: Payer: Self-pay | Admitting: Obstetrics & Gynecology

## 2018-08-08 ENCOUNTER — Other Ambulatory Visit: Payer: Self-pay | Admitting: Advanced Practice Midwife

## 2018-11-27 IMAGING — US US MFM OB COMP +14 WKS
1 series · 14 of 28 positions shown · non-contrast
Comparison: none

[Series 1: us mfm ob comp +14 wks · 14 of 99 slices shown]
[im 4/99]
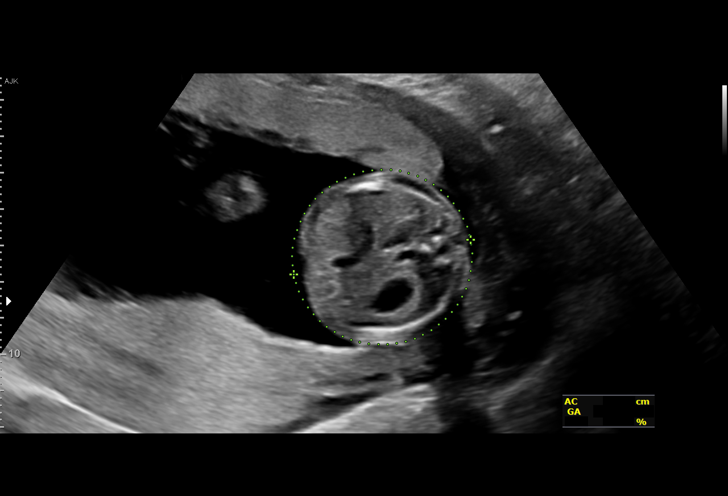
[im 11/99]
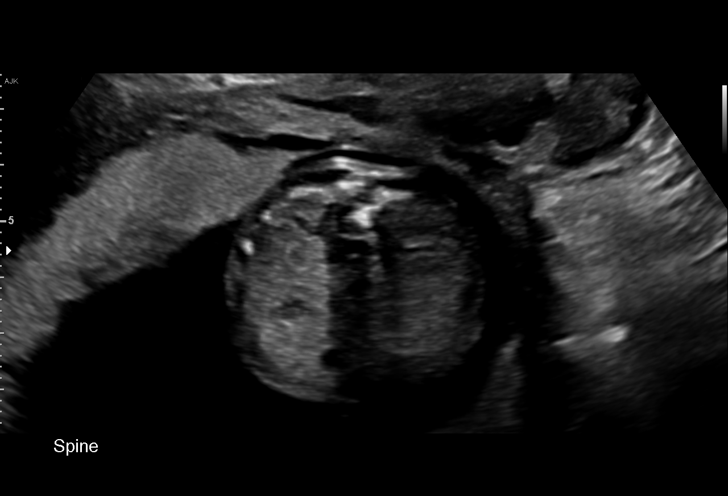
[im 19/99]
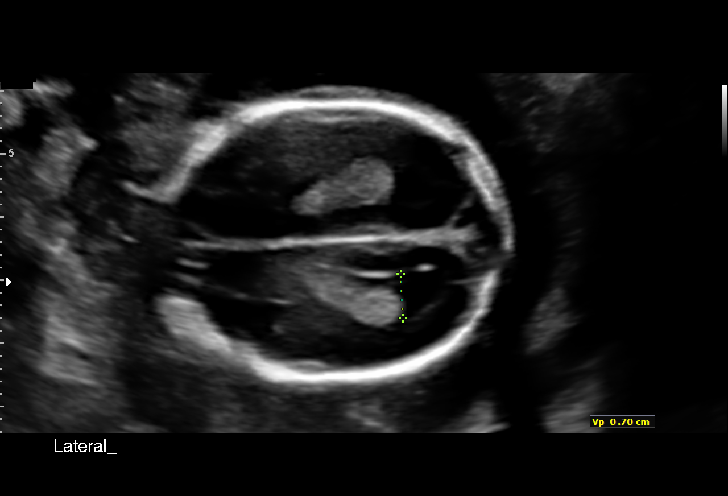
[im 26/99]
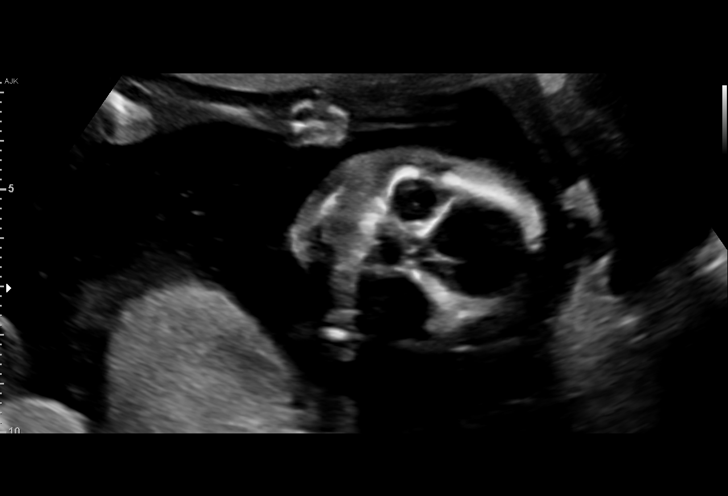
[im 33/99]
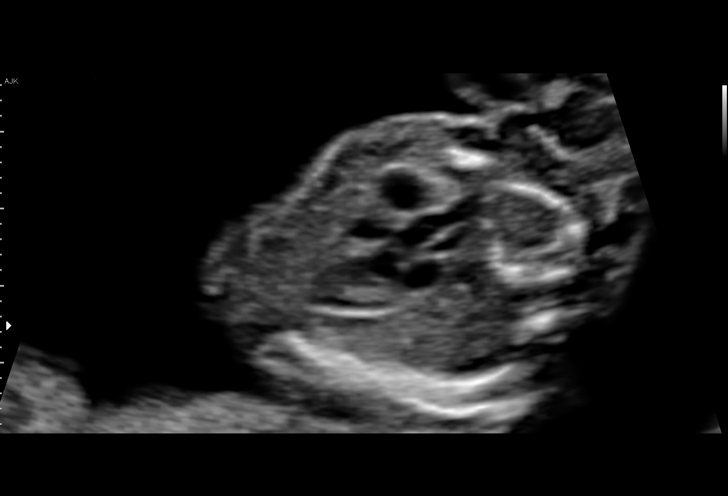
[im 40/99]
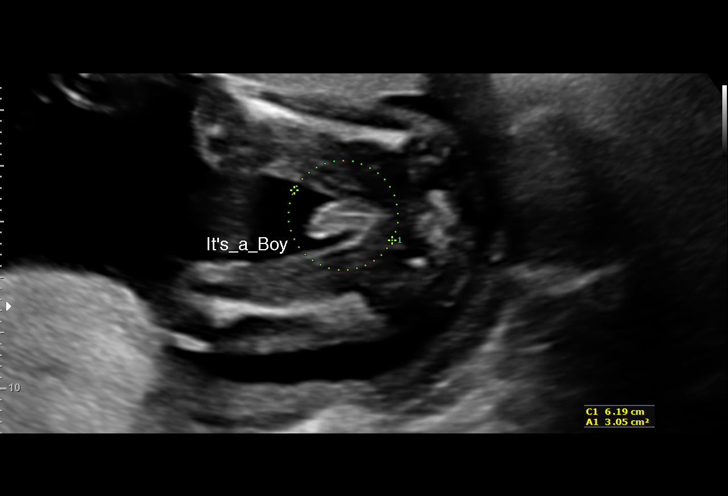
[im 48/99]
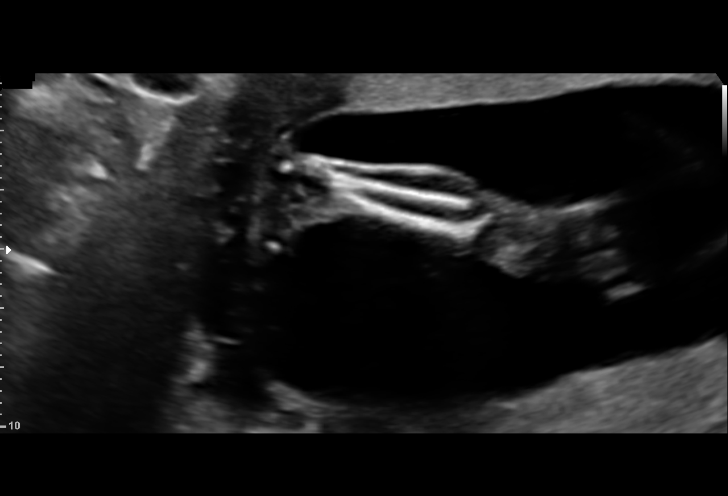
[im 55/99]
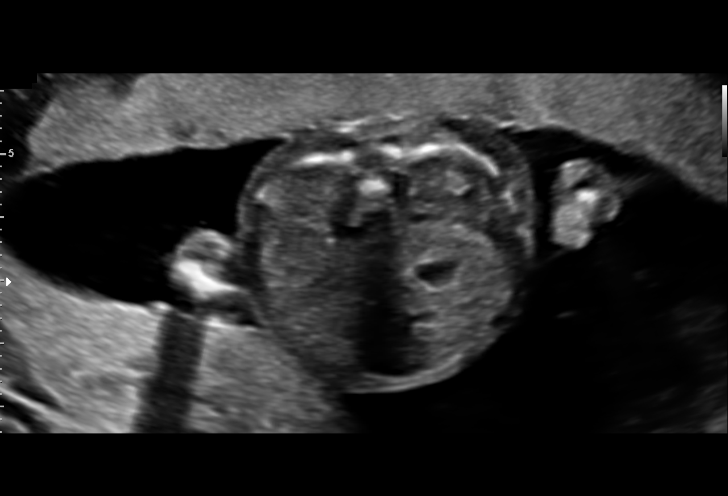
[im 62/99]
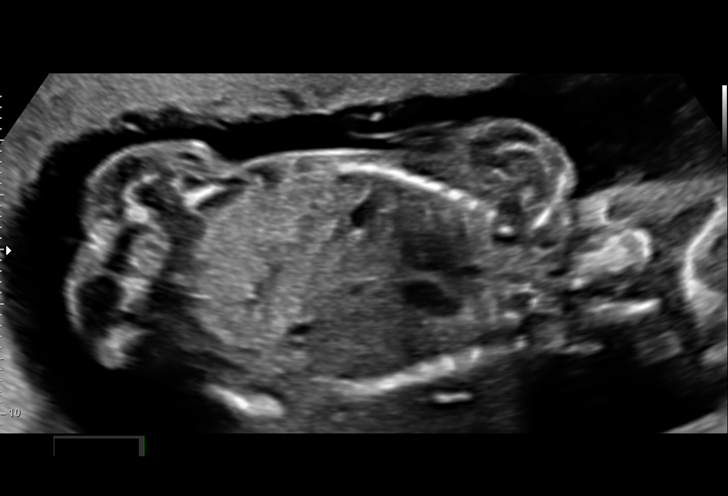
[im 69/99]
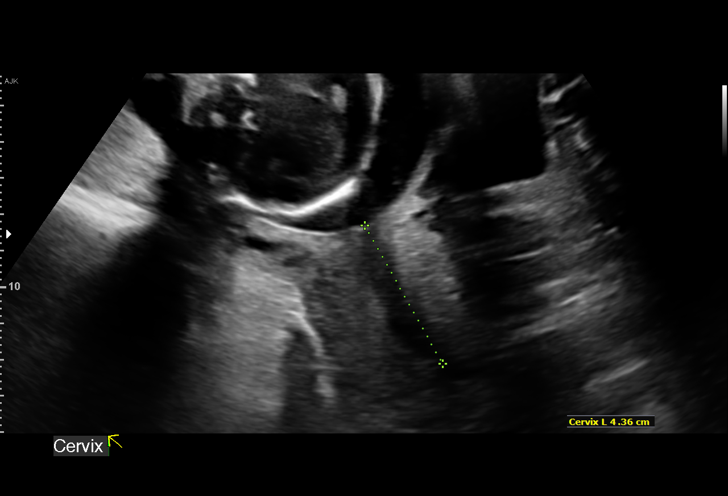
[im 77/99]
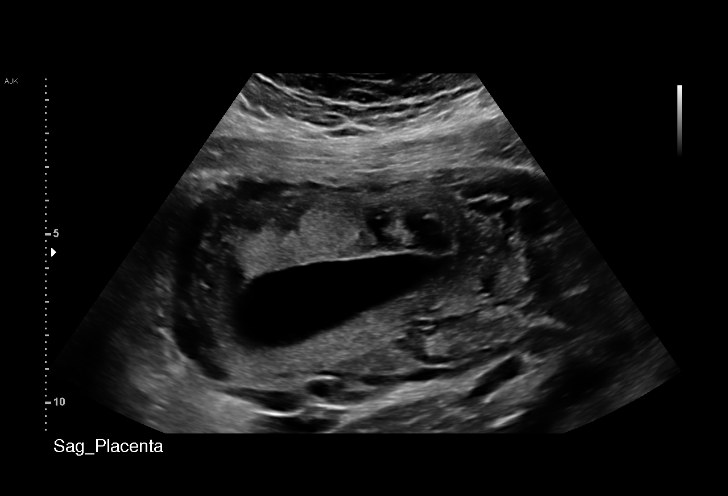
[im 84/99]
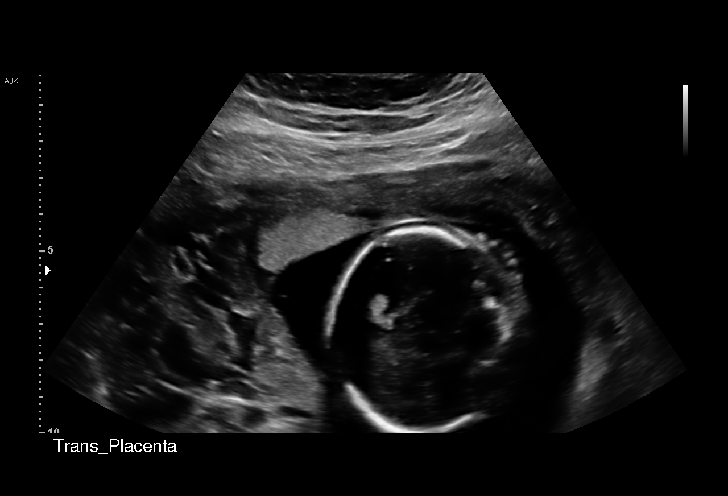
[im 91/99]
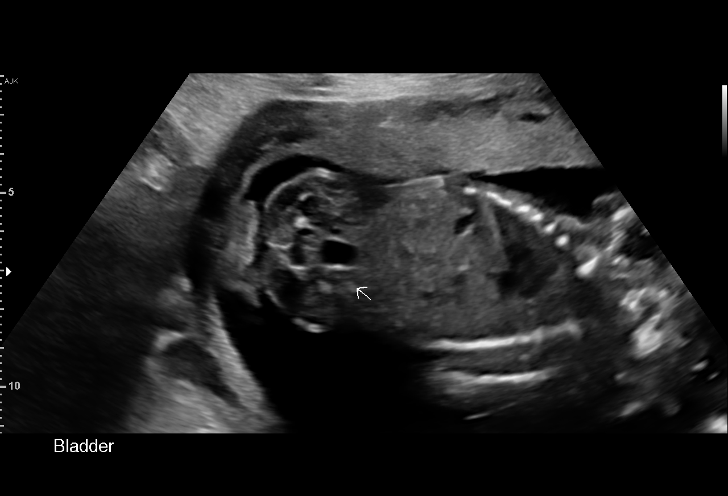
[im 99/99]
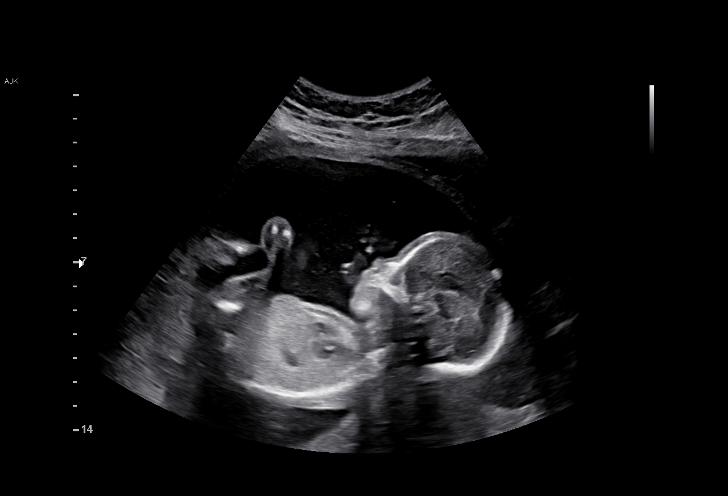

[14 of 28 positions shown; findings below may reference images not displayed]

[REDACTED]

KARINA
Indications

19 weeks gestation of pregnancy
History of sickle cell trait (FOB)
Encounter for antenatal screening for
malformations
Asthma                                         133.T3 [id number]
OB History

Gravidity:    3         Term:   2
Living:       2
Fetal Evaluation

Num Of Fetuses:     1
Fetal Heart         154
Rate(bpm):
Cardiac Activity:   Observed
Presentation:       Cephalic
Placenta:           Anterior, above cervical os
P. Cord Insertion:  Visualized, central

Amniotic Fluid
AFI FV:      Subjectively within normal limits

Largest Pocket(cm)
4.01
Biometry
BPD:      44.7  mm     G. Age:  19w 4d         72  %    CI:        72.26   %    70 - 86
FL/HC:      17.6   %    16.1 -
HC:      167.3  mm     G. Age:  19w 3d         62  %    HC/AC:      1.10        1.09 -
AC:      151.9  mm     G. Age:  20w 3d         86  %    FL/BPD:     65.8   %
FL:       29.4  mm     G. Age:  19w 0d         45  %    FL/AC:      19.4   %    20 - 24
CER:      20.8  mm     G. Age:  19w 6d         68  %
NFT:       4.7  mm
CM:        3.5  mm

Est. FW:     310  gm    0 lb 11 oz      56  %
Gestational Age

LMP:           20w 2d        Date:  05/22/17                 EDD:   02/26/18
U/S Today:     19w 4d                                        EDD:   03/03/18
Best:          19w 0d     Det. By:  Early Ultrasound         EDD:   03/07/18
(07/19/17)
Anatomy

Cranium:               Appears normal         Aortic Arch:            Appears normal
Cavum:                 Appears normal         Ductal Arch:            Appears normal
Ventricles:            Appears normal         Diaphragm:              Appears normal
Choroid Plexus:        Appears normal         Stomach:                Appears normal, left
sided
Cerebellum:            Appears normal         Abdomen:                Appears normal
Posterior Fossa:       Appears normal         Abdominal Wall:         Appears nml (cord
insert, abd wall)
Nuchal Fold:           Appears normal         Cord Vessels:           Appears normal (3
vessel cord)
Face:                  Appears normal         Kidneys:                Appear normal
(orbits and profile)
Lips:                  Appears normal         Bladder:                Appears normal
Thoracic:              Appears normal         Spine:                  Appears normal
Heart:                 Appears normal         Upper Extremities:      Appears normal
(4CH, axis, and
situs)
RVOT:                  Appears normal         Lower Extremities:      Appears normal
LVOT:                  Appears normal

Other:  Fetus appears to be a male. Heels and lt 5th digit visualized.
Cervix Uterus Adnexa

Cervix
Length:           4.36  cm.
Normal appearance by transabdominal scan.

Uterus
No abnormality visualized.

Left Ovary
Within normal limits.

Right Ovary
Within normal limits.

Adnexa:       No abnormality visualized.
Impression

Single living intrauterine pregnancy at 19w 0d.
Cephalic presentation.
Placenta Anterior, above cervical os.
Appropriate fetal growth.
Normal amniotic fluid volume.
The fetal anatomic survey is complete.
Normal fetal anatomy.
No fetal anomalies or soft markers of aneuploidy seen.
The adnexa appear normal bilaterally without masses.
The cervix measures 4.36cm on transabdominal imaging
without funneling.
Recommendations

Follow-up ultrasounds as clinically indicated.
Recommend FOB have screening hemoglobin
electrophoresis given maternal hx of sickle cell trait.

## 2019-03-01 ENCOUNTER — Other Ambulatory Visit: Payer: Self-pay | Admitting: Advanced Practice Midwife

## 2020-03-10 ENCOUNTER — Telehealth: Payer: Self-pay

## 2020-03-10 ENCOUNTER — Telehealth: Payer: Self-pay | Admitting: General Practice

## 2020-03-10 NOTE — Telephone Encounter (Signed)
I return Pt call, Unable to LVM since she does not have one, also she need to be an estatblish Pt with on the PCP doctor before she apply for the [facility] discount and the OC program

## 2020-03-10 NOTE — Telephone Encounter (Signed)
Copied from CRM [id number]. Topic: Appointment Scheduling - Scheduling Inquiry for Clinic >> Mar 10, 2020 10:07 AM Crist Infante wrote: Reason for CRM: pt would like appt for the orange card

## 2022-05-13 ENCOUNTER — Encounter: Payer: Self-pay | Admitting: General Practice

## 2022-05-27 ENCOUNTER — Ambulatory Visit: Payer: Medicaid Other | Admitting: Family Medicine

## 2022-06-02 DIAGNOSIS — Z1231 Encounter for screening mammogram for malignant neoplasm of breast: Secondary | ICD-10-CM

## 2022-07-15 ENCOUNTER — Encounter: Payer: Medicaid Other | Admitting: Family Medicine

## 2023-09-08 ENCOUNTER — Ambulatory Visit: Admitting: Family Medicine
